# Patient Record
Sex: Female | Born: 1960 | Race: White | Hispanic: No | Marital: Married | Smoking: Never smoker
Health system: Southern US, Community
[De-identification: ages and names within clinical notes are randomized; demographics above are authoritative.]

## PROBLEM LIST (undated history)

## (undated) DIAGNOSIS — F329 Major depressive disorder, single episode, unspecified: Secondary | ICD-10-CM

## (undated) DIAGNOSIS — D0510 Intraductal carcinoma in situ of unspecified breast: Principal | ICD-10-CM

## (undated) DIAGNOSIS — Z808 Family history of malignant neoplasm of other organs or systems: Secondary | ICD-10-CM

## (undated) DIAGNOSIS — K649 Unspecified hemorrhoids: Secondary | ICD-10-CM

## (undated) DIAGNOSIS — Z803 Family history of malignant neoplasm of breast: Secondary | ICD-10-CM

## (undated) DIAGNOSIS — K5792 Diverticulitis of intestine, part unspecified, without perforation or abscess without bleeding: Secondary | ICD-10-CM

## (undated) DIAGNOSIS — Z800 Family history of malignant neoplasm of digestive organs: Secondary | ICD-10-CM

## (undated) DIAGNOSIS — F419 Anxiety disorder, unspecified: Secondary | ICD-10-CM

## (undated) DIAGNOSIS — S329XXA Fracture of unspecified parts of lumbosacral spine and pelvis, initial encounter for closed fracture: Secondary | ICD-10-CM

## (undated) HISTORY — DX: Intraductal carcinoma in situ of unspecified breast: D05.10

## (undated) HISTORY — DX: Diverticulitis of intestine, part unspecified, without perforation or abscess without bleeding: K57.92

## (undated) HISTORY — DX: Unspecified hemorrhoids: K64.9

## (undated) HISTORY — DX: Family history of malignant neoplasm of other organs or systems: Z80.8

## (undated) HISTORY — DX: Family history of malignant neoplasm of digestive organs: Z80.0

## (undated) HISTORY — DX: Fracture of unspecified parts of lumbosacral spine and pelvis, initial encounter for closed fracture: S32.9XXA

## (undated) HISTORY — DX: Family history of malignant neoplasm of breast: Z80.3

---
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
## 1986-04-24 HISTORY — PX: DILATION AND CURETTAGE OF UTERUS: SHX78

## 1997-08-10 ENCOUNTER — Ambulatory Visit (HOSPITAL_COMMUNITY): Admission: RE | Admit: 1997-08-10 | Discharge: 1997-08-10 | Payer: Self-pay | Admitting: Obstetrics and Gynecology

## 1998-09-17 ENCOUNTER — Other Ambulatory Visit: Admission: RE | Admit: 1998-09-17 | Discharge: 1998-09-17 | Payer: Self-pay | Admitting: Obstetrics and Gynecology

## 1999-11-10 ENCOUNTER — Other Ambulatory Visit: Admission: RE | Admit: 1999-11-10 | Discharge: 1999-11-10 | Payer: Self-pay | Admitting: Obstetrics and Gynecology

## 2000-11-12 ENCOUNTER — Other Ambulatory Visit: Admission: RE | Admit: 2000-11-12 | Discharge: 2000-11-12 | Payer: Self-pay | Admitting: Obstetrics and Gynecology

## 2001-02-20 ENCOUNTER — Encounter (INDEPENDENT_AMBULATORY_CARE_PROVIDER_SITE_OTHER): Payer: Self-pay

## 2001-02-20 ENCOUNTER — Ambulatory Visit (HOSPITAL_COMMUNITY): Admission: RE | Admit: 2001-02-20 | Discharge: 2001-02-20 | Payer: Self-pay | Admitting: Gastroenterology

## 2001-07-30 ENCOUNTER — Ambulatory Visit (HOSPITAL_COMMUNITY): Admission: RE | Admit: 2001-07-30 | Discharge: 2001-07-30 | Payer: Self-pay | Admitting: Family Medicine

## 2001-07-30 ENCOUNTER — Encounter: Payer: Self-pay | Admitting: Family Medicine

## 2001-11-13 ENCOUNTER — Other Ambulatory Visit: Admission: RE | Admit: 2001-11-13 | Discharge: 2001-11-13 | Payer: Self-pay | Admitting: Obstetrics and Gynecology

## 2002-03-19 ENCOUNTER — Ambulatory Visit (HOSPITAL_COMMUNITY): Admission: RE | Admit: 2002-03-19 | Discharge: 2002-03-19 | Payer: Self-pay | Admitting: Obstetrics and Gynecology

## 2002-03-19 ENCOUNTER — Encounter: Payer: Self-pay | Admitting: Obstetrics and Gynecology

## 2002-04-10 ENCOUNTER — Encounter: Payer: Self-pay | Admitting: Family Medicine

## 2002-04-10 ENCOUNTER — Ambulatory Visit (HOSPITAL_COMMUNITY): Admission: RE | Admit: 2002-04-10 | Discharge: 2002-04-10 | Payer: Self-pay | Admitting: Family Medicine

## 2002-12-10 ENCOUNTER — Other Ambulatory Visit: Admission: RE | Admit: 2002-12-10 | Discharge: 2002-12-10 | Payer: Self-pay | Admitting: Obstetrics and Gynecology

## 2004-01-12 ENCOUNTER — Ambulatory Visit (HOSPITAL_COMMUNITY): Admission: RE | Admit: 2004-01-12 | Discharge: 2004-01-12 | Payer: Self-pay | Admitting: Obstetrics and Gynecology

## 2004-01-21 ENCOUNTER — Other Ambulatory Visit: Admission: RE | Admit: 2004-01-21 | Discharge: 2004-01-21 | Payer: Self-pay | Admitting: Obstetrics and Gynecology

## 2005-02-01 ENCOUNTER — Other Ambulatory Visit: Admission: RE | Admit: 2005-02-01 | Discharge: 2005-02-01 | Payer: Self-pay | Admitting: Obstetrics and Gynecology

## 2005-02-06 ENCOUNTER — Encounter: Admission: RE | Admit: 2005-02-06 | Discharge: 2005-02-06 | Payer: Self-pay | Admitting: Obstetrics and Gynecology

## 2006-03-08 ENCOUNTER — Ambulatory Visit (HOSPITAL_COMMUNITY): Admission: RE | Admit: 2006-03-08 | Discharge: 2006-03-08 | Payer: Self-pay | Admitting: Obstetrics and Gynecology

## 2006-03-20 ENCOUNTER — Encounter: Admission: RE | Admit: 2006-03-20 | Discharge: 2006-03-20 | Payer: Self-pay | Admitting: Obstetrics and Gynecology

## 2006-04-03 ENCOUNTER — Other Ambulatory Visit: Admission: RE | Admit: 2006-04-03 | Discharge: 2006-04-03 | Payer: Self-pay | Admitting: Obstetrics & Gynecology

## 2007-04-11 ENCOUNTER — Ambulatory Visit (HOSPITAL_COMMUNITY): Admission: RE | Admit: 2007-04-11 | Discharge: 2007-04-11 | Payer: Self-pay | Admitting: Obstetrics and Gynecology

## 2007-08-23 ENCOUNTER — Other Ambulatory Visit: Admission: RE | Admit: 2007-08-23 | Discharge: 2007-08-23 | Payer: Self-pay | Admitting: Obstetrics & Gynecology

## 2008-04-14 ENCOUNTER — Ambulatory Visit (HOSPITAL_COMMUNITY): Admission: RE | Admit: 2008-04-14 | Discharge: 2008-04-14 | Payer: Self-pay | Admitting: Obstetrics and Gynecology

## 2008-08-24 ENCOUNTER — Other Ambulatory Visit: Admission: RE | Admit: 2008-08-24 | Discharge: 2008-08-24 | Payer: Self-pay | Admitting: Obstetrics and Gynecology

## 2008-09-25 ENCOUNTER — Encounter: Admission: RE | Admit: 2008-09-25 | Discharge: 2008-09-25 | Payer: Self-pay | Admitting: Sports Medicine

## 2008-10-14 ENCOUNTER — Ambulatory Visit: Payer: Self-pay | Admitting: Thoracic Surgery

## 2008-11-18 ENCOUNTER — Encounter: Admission: RE | Admit: 2008-11-18 | Discharge: 2008-11-18 | Payer: Self-pay | Admitting: Thoracic Surgery

## 2008-11-18 ENCOUNTER — Ambulatory Visit: Payer: Self-pay | Admitting: Thoracic Surgery

## 2009-01-22 LAB — HM COLONOSCOPY: HM Colonoscopy: NORMAL

## 2009-04-15 ENCOUNTER — Ambulatory Visit (HOSPITAL_COMMUNITY): Admission: RE | Admit: 2009-04-15 | Discharge: 2009-04-15 | Payer: Self-pay | Admitting: Obstetrics and Gynecology

## 2010-04-19 ENCOUNTER — Ambulatory Visit (HOSPITAL_COMMUNITY)
Admission: RE | Admit: 2010-04-19 | Discharge: 2010-04-19 | Payer: Self-pay | Source: Home / Self Care | Attending: Obstetrics and Gynecology | Admitting: Obstetrics and Gynecology

## 2010-04-20 ENCOUNTER — Encounter
Admission: RE | Admit: 2010-04-20 | Discharge: 2010-04-20 | Payer: Self-pay | Source: Home / Self Care | Attending: Obstetrics and Gynecology | Admitting: Obstetrics and Gynecology

## 2010-04-21 HISTORY — PX: BREAST BIOPSY: SHX20

## 2010-04-24 DIAGNOSIS — F419 Anxiety disorder, unspecified: Secondary | ICD-10-CM

## 2010-04-24 DIAGNOSIS — F32A Depression, unspecified: Secondary | ICD-10-CM

## 2010-04-24 HISTORY — DX: Depression, unspecified: F32.A

## 2010-04-24 HISTORY — DX: Anxiety disorder, unspecified: F41.9

## 2010-04-26 ENCOUNTER — Encounter
Admission: RE | Admit: 2010-04-26 | Discharge: 2010-04-26 | Payer: Self-pay | Source: Home / Self Care | Attending: Obstetrics and Gynecology | Admitting: Obstetrics and Gynecology

## 2010-04-29 ENCOUNTER — Ambulatory Visit: Payer: Self-pay | Admitting: Oncology

## 2010-05-02 ENCOUNTER — Ambulatory Visit: Payer: Self-pay | Admitting: Genetic Counselor

## 2010-05-06 ENCOUNTER — Encounter
Admission: RE | Admit: 2010-05-06 | Discharge: 2010-05-06 | Payer: Self-pay | Source: Home / Self Care | Attending: Obstetrics and Gynecology | Admitting: Obstetrics and Gynecology

## 2010-05-06 HISTORY — PX: BREAST BIOPSY: SHX20

## 2010-05-09 LAB — COMPREHENSIVE METABOLIC PANEL
ALT: 17 U/L (ref 0–35)
AST: 26 U/L (ref 0–37)
Albumin: 3.8 g/dL (ref 3.5–5.2)
Alkaline Phosphatase: 49 U/L (ref 39–117)
BUN: 13 mg/dL (ref 6–23)
CO2: 29 mEq/L (ref 19–32)
Calcium: 8.9 mg/dL (ref 8.4–10.5)
Chloride: 105 mEq/L (ref 96–112)
Creatinine, Ser: 0.84 mg/dL (ref 0.40–1.20)
Glucose, Bld: 110 mg/dL — ABNORMAL HIGH (ref 70–99)
Potassium: 4.1 mEq/L (ref 3.5–5.3)
Sodium: 141 mEq/L (ref 135–145)
Total Bilirubin: 0.3 mg/dL (ref 0.3–1.2)
Total Protein: 6.6 g/dL (ref 6.0–8.3)

## 2010-05-09 LAB — CBC WITH DIFFERENTIAL/PLATELET
BASO%: 0.6 % (ref 0.0–2.0)
Basophils Absolute: 0 10*3/uL (ref 0.0–0.1)
EOS%: 0.8 % (ref 0.0–7.0)
Eosinophils Absolute: 0.1 10*3/uL (ref 0.0–0.5)
HCT: 36.6 % (ref 34.8–46.6)
HGB: 12.5 g/dL (ref 11.6–15.9)
LYMPH%: 29.9 % (ref 14.0–49.7)
MCH: 34.3 pg — ABNORMAL HIGH (ref 25.1–34.0)
MCHC: 34.3 g/dL (ref 31.5–36.0)
MCV: 100.3 fL (ref 79.5–101.0)
MONO#: 0.6 10*3/uL (ref 0.1–0.9)
MONO%: 7.6 % (ref 0.0–14.0)
NEUT#: 4.6 10*3/uL (ref 1.5–6.5)
NEUT%: 61.1 % (ref 38.4–76.8)
Platelets: 202 10*3/uL (ref 145–400)
RBC: 3.65 10*6/uL — ABNORMAL LOW (ref 3.70–5.45)
RDW: 12.9 % (ref 11.2–14.5)
WBC: 7.5 10*3/uL (ref 3.9–10.3)
lymph#: 2.2 10*3/uL (ref 0.9–3.3)

## 2010-05-12 ENCOUNTER — Ambulatory Visit
Admission: RE | Admit: 2010-05-12 | Discharge: 2010-05-24 | Payer: Self-pay | Source: Home / Self Care | Attending: Radiation Oncology | Admitting: Radiation Oncology

## 2010-05-15 ENCOUNTER — Encounter: Payer: Self-pay | Admitting: Obstetrics and Gynecology

## 2010-05-25 ENCOUNTER — Other Ambulatory Visit: Payer: Self-pay | Admitting: Surgery

## 2010-05-25 DIAGNOSIS — C50911 Malignant neoplasm of unspecified site of right female breast: Secondary | ICD-10-CM

## 2010-05-25 HISTORY — PX: BREAST SURGERY: SHX581

## 2010-05-25 HISTORY — PX: HEMATOMA EVACUATION: SHX5118

## 2010-05-26 ENCOUNTER — Other Ambulatory Visit: Payer: Self-pay | Admitting: Surgery

## 2010-05-26 DIAGNOSIS — C50911 Malignant neoplasm of unspecified site of right female breast: Secondary | ICD-10-CM

## 2010-05-30 ENCOUNTER — Encounter (HOSPITAL_BASED_OUTPATIENT_CLINIC_OR_DEPARTMENT_OTHER)
Admission: RE | Admit: 2010-05-30 | Discharge: 2010-05-30 | Disposition: A | Discharge status: Home / Self Care | Payer: BC Managed Care – PPO | Source: Ambulatory Visit | Attending: Surgery | Admitting: Surgery

## 2010-05-30 DIAGNOSIS — Z01812 Encounter for preprocedural laboratory examination: Secondary | ICD-10-CM | POA: Insufficient documentation

## 2010-05-30 DIAGNOSIS — Z01810 Encounter for preprocedural cardiovascular examination: Secondary | ICD-10-CM | POA: Insufficient documentation

## 2010-05-30 LAB — URINALYSIS, ROUTINE W REFLEX MICROSCOPIC
Hgb urine dipstick: NEGATIVE
Ketones, ur: NEGATIVE mg/dL
Protein, ur: NEGATIVE mg/dL
Urobilinogen, UA: 0.2 mg/dL (ref 0.0–1.0)

## 2010-05-30 LAB — CBC
HCT: 41.1 % (ref 36.0–46.0)
MCH: 32.8 pg (ref 26.0–34.0)
MCHC: 33.6 g/dL (ref 30.0–36.0)
RDW: 12 % (ref 11.5–15.5)

## 2010-05-30 LAB — COMPREHENSIVE METABOLIC PANEL
ALT: 22 U/L (ref 0–35)
AST: 28 U/L (ref 0–37)
Albumin: 4 g/dL (ref 3.5–5.2)
Alkaline Phosphatase: 54 U/L (ref 39–117)
CO2: 28 mEq/L (ref 19–32)
Chloride: 104 mEq/L (ref 96–112)
Creatinine, Ser: 0.9 mg/dL (ref 0.4–1.2)
GFR calc Af Amer: >60 mL/min (ref >60)
GFR calc non Af Amer: >60 mL/min (ref >60)
Potassium: 5 mEq/L (ref 3.5–5.1)
Total Bilirubin: 0.6 mg/dL (ref 0.3–1.2)

## 2010-05-30 LAB — DIFFERENTIAL
Basophils Absolute: 0 10*3/uL (ref 0.0–0.1)
Basophils Relative: 1 % (ref 0–1)
Eosinophils Relative: 2 % (ref 0–5)
Monocytes Absolute: 0.6 10*3/uL (ref 0.1–1.0)

## 2010-05-30 LAB — URINE MICROSCOPIC-ADD ON

## 2010-06-02 ENCOUNTER — Other Ambulatory Visit: Payer: Self-pay | Admitting: Surgery

## 2010-06-02 ENCOUNTER — Ambulatory Visit
Admission: RE | Admit: 2010-06-02 | Discharge: 2010-06-02 | Disposition: A | Discharge status: Home / Self Care | Payer: BC Managed Care – PPO | Source: Ambulatory Visit | Attending: Surgery | Admitting: Surgery

## 2010-06-02 ENCOUNTER — Observation Stay (HOSPITAL_COMMUNITY)
Admission: EM | Admit: 2010-06-02 | Discharge: 2010-06-03 | Disposition: A | Discharge status: Home / Self Care | Payer: BC Managed Care – PPO | Attending: Surgery | Admitting: Surgery

## 2010-06-02 ENCOUNTER — Ambulatory Visit (HOSPITAL_BASED_OUTPATIENT_CLINIC_OR_DEPARTMENT_OTHER)
Admission: RE | Admit: 2010-06-02 | Discharge: 2010-06-02 | Disposition: A | Discharge status: Home / Self Care | Payer: BC Managed Care – PPO | Source: Ambulatory Visit | Attending: Surgery | Admitting: Surgery

## 2010-06-02 DIAGNOSIS — D0590 Unspecified type of carcinoma in situ of unspecified breast: Secondary | ICD-10-CM | POA: Insufficient documentation

## 2010-06-02 DIAGNOSIS — Z01818 Encounter for other preprocedural examination: Secondary | ICD-10-CM | POA: Insufficient documentation

## 2010-06-02 DIAGNOSIS — C50911 Malignant neoplasm of unspecified site of right female breast: Secondary | ICD-10-CM

## 2010-06-02 DIAGNOSIS — F3289 Other specified depressive episodes: Secondary | ICD-10-CM | POA: Insufficient documentation

## 2010-06-02 DIAGNOSIS — IMO0002 Reserved for concepts with insufficient information to code with codable children: Principal | ICD-10-CM | POA: Insufficient documentation

## 2010-06-02 DIAGNOSIS — Y849 Medical procedure, unspecified as the cause of abnormal reaction of the patient, or of later complication, without mention of misadventure at the time of the procedure: Secondary | ICD-10-CM | POA: Insufficient documentation

## 2010-06-02 DIAGNOSIS — Z01812 Encounter for preprocedural laboratory examination: Secondary | ICD-10-CM | POA: Insufficient documentation

## 2010-06-02 DIAGNOSIS — Z79899 Other long term (current) drug therapy: Secondary | ICD-10-CM | POA: Insufficient documentation

## 2010-06-02 DIAGNOSIS — N63 Unspecified lump in unspecified breast: Secondary | ICD-10-CM | POA: Insufficient documentation

## 2010-06-02 DIAGNOSIS — F329 Major depressive disorder, single episode, unspecified: Secondary | ICD-10-CM | POA: Insufficient documentation

## 2010-06-02 HISTORY — PX: BREAST LUMPECTOMY: SHX2

## 2010-06-02 LAB — CBC
MCHC: 34.5 g/dL (ref 30.0–36.0)
Platelets: 206 10*3/uL (ref 150–400)
RDW: 11.8 % (ref 11.5–15.5)
WBC: 12.3 10*3/uL — ABNORMAL HIGH (ref 4.0–10.5)

## 2010-06-02 LAB — DIFFERENTIAL
Basophils Absolute: 0 10*3/uL (ref 0.0–0.1)
Basophils Relative: 0 % (ref 0–1)
Eosinophils Absolute: 0 10*3/uL (ref 0.0–0.7)
Eosinophils Relative: 0 % (ref 0–5)
Monocytes Absolute: 0.9 10*3/uL (ref 0.1–1.0)

## 2010-06-05 NOTE — Op Note (Signed)
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  OPERATION:  Needle-guided excision right breast cancer.  SURGEON:  Currie Paris, MD  ANESTHESIA:  General.  CLINICAL HISTORY:  This is a 50 year old lady recently diagnosed with an area of DCIS in the upper outer quadrant of the right breast.  A clip was placed in the calcifications that were known to be malignant.  For her guidewire localization, a second wire was placed so we could excise nearby calcifications that had not been biopsied.  The third area in her breast had been biopsied and was normal.  DESCRIPTION OF PROCEDURE:  I saw the patient in the holding area and we marked the right breast as the operative side.  She had no further questions.  I reviewed the mammogram localizing films.  The patient was taken to the operating room, and after satisfactory general anesthesia had been obtained the breast was prepped and draped. The time-out was done.  Both guidewires were entered in the upper outer quadrant laterally and tracked to what appeared to be in a horizontal direction from lateral to medial.  I made an incision starting at the guidewire entry site between the two guidewires and went medial from there.  I raised about 1.5-cm skin flap superiorly and inferiorly and then laterally and manipulated both guidewires into the wound.  By looking at the films, I estimated where I thought I would be beyond the tip of the guidewire and then divided the  breast tissue down to the chest wall medial to the two guidewires and indeed I was medial to them. I then divided the breast tissue down to the chest wall superiorly, then inferiorly, and then freed it off by taking the fascia.  I was finally able to divide the remaining lateral attachments and in doing so noticed that the more superior of the two wires was really at the edge of my lumpectomy and was running along that area and actually tracked more from directly towards the chest wall.  I was clearly well around the wire that was going to the clip and a specimen mammogram confirmed that.  I went ahead and took an extra rim of tissue from the superolateral portion of the margin which is where I thought the guidewire had been close.  I put some Marcaine in to help with postop analgesia.  I made sure everything was dry.  I freed the breast tissue off the underlying pectoralis and closed the deeper breast tissue with some 3-0 Vicryls followed by some more Vicryls on the subcutaneous tissue, 4-0 Monocryl subcuticular, and Dermabond.The patient tolerated the procedure well, and there were no complications.  Counts  were correct.     Currie Paris, M.D.     CJS/MEDQ  D:  06/02/2010  T:  06/03/2010  Job:  [id number]  cc:   Aram Beecham P. Romine, M.D. Robert A. Nicholos Johns, M.D.  Electronically Signed by Cyndia Bent M.D. on 06/04/2010 02:42:02 PM

## 2010-06-07 NOTE — H&P (Signed)
[name], [name]                ACCOUNT NO.:  [id number]  MEDICAL RECORD NO.:  [id number]           PATIENT TYPE:  I  LOCATION:  1305                         FACILITY:  [facility]  PHYSICIAN:  Sandria Bales. Ezzard Standing, M.D.  DATE OF BIRTH:  July 09, 1960  DATE OF ADMISSION:  06/02/2010                             HISTORY & PHYSICAL  HISTORY:  This is a 50 year old white female who is a patient of Dr. Cyndia Bent who underwent a right breast lumpectomy today at [facility].  She was having a lumpectomy for right breast cancer.  She had a biopsy on April 12, 2010, that showed a DCIS which is ER 98%, PR 93%.  I did not have her mammogram reports or her other breast information.  She has seen Dr. Pierce Crane from an oncology standpoint, Dr. Lonie Peak from radiation oncology standpoint.  She sees Dr. Elias Else as her primary medical doctor.  She had the biopsy about 11 a.m. this morning, was doing well when she felt a "pop" at home around 6 p.m., noticed rapid swelling of her right breast with increasing tenderness.  She tried to stay at home but eventually called me and I agreed to meet her in the emergency room for evaluation of her right breast.  PAST MEDICAL HISTORY:  She has no allergies.  MEDICINES:  Her medicine circle primarily around depression she is having.  She has been placed on Viibryd by Dr. Milagros Evener for depression.  She takes some Sonata equivalent for sleep and takes Xanax p.r.n. for anxiety.  REVIEW OF SYSTEMS:   NEUROLOGIC:  No seizure or loss of consciousness. PULMONARY:  No history of pneumonia or tuberculosis.   CARDIAC:  She has no heart disease or chest pain.   GASTROINTESTINAL:  No history of peptic ulcer disease or liver disease.   UROLOGIC:  She has had kidney infection.    She is accompanied by her husband in the emergency room. When she first came, she had pain and nausea.  She was given some Zofran sublingually, this seem to take care of  her nausea.  She was given 0.5 mg of Dilaudid, this seem to take care of her pain. She works as an Scientist, forensic and competes in Clinical cytogeneticist.  PHYSICAL EXAMINATION:  VITAL SIGNS:  On physical exam, her temperature was 97.4, blood pressure 110/71, pulse is 55, respirations 21. HEENT:  Unremarkable. NECK:  Supple.  No mass, thyromegaly. LYMPH NODES:  She has no cervical, supraclavicular adenopathy.   BREAST: She has an incision at the upper outer quadrant of her right breast with a fairly significant hematoma under this incision.  It is tense and tender. Her left breast is unremarkable. LUNGS:  Clear to auscultation with symmetric breath sounds. HEART:  Her heart has a regular rate and rhythm without murmur or rub. ABDOMEN:  Benign.  Her hemoglobin tonight was 12.6, hematocrit 36.5, [place] [address].  She has electrolytes from May 30, 2010, which shows sodium 140, potassium 5.0, chloride of 104, CO2 of 28, glucose of 93, creatinine 0.9.  IMPRESSION: 1. Right breast hematoma.  I think  the patient will be best served     with evacuation of this hematoma.  This would be done under general anesthesia. I discussed with her there may be a slight increased risk of wound infection, but evacuating the     hematoma would help her lot with her pain and in the assymetry of her breast.  And if the hematoma were     to burst open the incision, then the risk of infection will go up dramatically     over just surgical evacuation of hematoma. 2. Depression.  She seems to be dealing with multiple stressors in her     life.  She otherwise appears to be healthy.   Sandria Bales. Ezzard Standing, M.D., FACS   DHN/MEDQ  D:  06/02/2010  T:  06/03/2010  Job:  [id number]  Electronically Signed by Ovidio Kin M.D. on 06/07/2010 09:02:45 AM

## 2010-06-07 NOTE — Op Note (Signed)
[name], [name]                ACCOUNT NO.:  [id number]  MEDICAL RECORD NO.:  [id number]           PATIENT TYPE:  E  LOCATION:  WLED                         FACILITY:  [facility]  PHYSICIAN:  Sandria Bales. Ezzard Standing, M.D.  DATE OF BIRTH:  April 19, 1961  DATE OF PROCEDURE:  06/02/2010                              OPERATIVE REPORT  PREOP DIAGNOSIS:  Right breast incisional hematoma.  POSTOPERATIVE DIAGNOSIS:  Right breast incisional hematoma (250 to 300 cc of clotted blood).  PROCEDURES:  Evacuation, irrigation of right breast hematoma.  SURGEON:  Sandria Bales. Ezzard Standing, M.D.  FIRST ASSISTANT:  None.  ANESTHESIA:  General endotracheal with approximately 30 cc of 0.25% Marcaine.  COMPLICATIONS:  None.  INDICATIONS FOR PROCEDURE:  [name] is a 50 year old white female with right breast carcinoma who underwent a right breast lumpectomy by Dr. Jamey Ripa earlier today on June 02, 2010.  Approximately, 6 hours postop, she felt a "pop", had increasing pain in her right breast with increasing swelling. On physical exam, she has a significant hematoma in right breast.    I discussed with her about taking her to the operating room to explore and evacuate this right breas hematoma.  The risks of surgery include infection, repeat bleed, and trouble wound healing.  DESCRIPTION OF PROCEDURE:  The patient in supine position, her right breast was prepped with ChloraPrep and sterilely draped.  A time-out was held and the surgical checklist run.    She was given 1 g of Ancef.  An incision was made through her old incision and she had clotted blood down to her chest wall.  She had about 250 to 300 cc of clotted old blood.  There was no specific bleeder that was identified.  She was a little "wet" up towards her axilla.  I used both Bovie electrocautery and some 2-0 Vicryl sutures for ligation purposes.  I irrigated at least 2 L of saline into the wound.  I left a gauze in for at least 5 minutes  that showed no bleeding.  I left saline in the wound to look for bleeding and saw none.  Then after irrigating 2 L, I then infiltrated the subcutaneous tissues with 30 cc of 0.25% Marcaine.  I then tried to close the wound in layers much like that Dr. Jamey Ripa had done earlier with interrupted 2-0 Vicryl sutures and 3-0 Vicryl sutures under the subcutaneous sutures, and then I did a running 5-0 subcuticular suture and painted the wound with Dermabond, placed gauze on her, rewrapped the breast.    She tolerated the procedure well, was transferred to recovery room.  We are ending this case at about 12:20 a.m. on June 03, 2010.  I will keep her overnight for observation with Dr. Jamey Ripa to follow up in the morning.    Sandria Bales. Ezzard Standing, M.D., FACS   DHN/MEDQ  D:  06/03/2010  T:  06/03/2010  Job:  [id number]  cc:   Currie Paris, M.D. [address] [place] [address]  Pierce Crane, MD Fax: [phone]  Lonie Peak  Dr. Beckie Salts  Electronically Signed by Ovidio Kin  M.D. on 06/07/2010 09:06:01 AM

## 2010-06-16 ENCOUNTER — Ambulatory Visit: Payer: BC Managed Care – PPO | Attending: Radiation Oncology | Admitting: Radiation Oncology

## 2010-06-16 ENCOUNTER — Other Ambulatory Visit: Payer: Self-pay | Admitting: Radiation Oncology

## 2010-06-16 DIAGNOSIS — D0590 Unspecified type of carcinoma in situ of unspecified breast: Secondary | ICD-10-CM | POA: Insufficient documentation

## 2010-06-16 DIAGNOSIS — R920 Mammographic microcalcification found on diagnostic imaging of breast: Secondary | ICD-10-CM

## 2010-06-16 DIAGNOSIS — Z510 Encounter for antineoplastic radiation therapy: Secondary | ICD-10-CM | POA: Insufficient documentation

## 2010-06-21 ENCOUNTER — Other Ambulatory Visit: Payer: Self-pay | Admitting: Oncology

## 2010-06-21 ENCOUNTER — Encounter (HOSPITAL_BASED_OUTPATIENT_CLINIC_OR_DEPARTMENT_OTHER): Payer: BC Managed Care – PPO | Admitting: Oncology

## 2010-06-21 DIAGNOSIS — D0590 Unspecified type of carcinoma in situ of unspecified breast: Secondary | ICD-10-CM

## 2010-06-21 LAB — COMPREHENSIVE METABOLIC PANEL
ALT: 16 U/L (ref 0–35)
AST: 21 U/L (ref 0–37)
Albumin: 4.3 g/dL (ref 3.5–5.2)
Alkaline Phosphatase: 63 U/L (ref 39–117)
BUN: 13 mg/dL (ref 6–23)
CO2: 28 mEq/L (ref 19–32)
Calcium: 9.7 mg/dL (ref 8.4–10.5)
Chloride: 102 mEq/L (ref 96–112)
Creatinine, Ser: 0.82 mg/dL (ref 0.40–1.20)
Glucose, Bld: 111 mg/dL — ABNORMAL HIGH (ref 70–99)
Potassium: 4 mEq/L (ref 3.5–5.3)
Sodium: 139 mEq/L (ref 135–145)
Total Bilirubin: 0.6 mg/dL (ref 0.3–1.2)
Total Protein: 6.7 g/dL (ref 6.0–8.3)

## 2010-06-21 LAB — CBC WITH DIFFERENTIAL/PLATELET
BASO%: 0.3 % (ref 0.0–2.0)
EOS%: 0.7 % (ref 0.0–7.0)
HCT: 37.2 % (ref 34.8–46.6)
LYMPH%: 25.4 % (ref 14.0–49.7)
MCH: 34.4 pg — ABNORMAL HIGH (ref 25.1–34.0)
MCHC: 34.3 g/dL (ref 31.5–36.0)
MONO#: 0.4 10*3/uL (ref 0.1–0.9)
NEUT%: 66.5 % (ref 38.4–76.8)
RBC: 3.71 10*6/uL (ref 3.70–5.45)
WBC: 6.3 10*3/uL (ref 3.9–10.3)
lymph#: 1.6 10*3/uL (ref 0.9–3.3)

## 2010-06-21 LAB — RESEARCH LABS

## 2010-06-27 ENCOUNTER — Ambulatory Visit
Admission: RE | Admit: 2010-06-27 | Discharge: 2010-06-27 | Disposition: A | Discharge status: Home / Self Care | Payer: BC Managed Care – PPO | Source: Ambulatory Visit | Attending: Radiation Oncology | Admitting: Radiation Oncology

## 2010-06-27 DIAGNOSIS — R920 Mammographic microcalcification found on diagnostic imaging of breast: Secondary | ICD-10-CM

## 2010-08-16 ENCOUNTER — Encounter: Payer: BC Managed Care – PPO | Admitting: Oncology

## 2010-08-16 ENCOUNTER — Other Ambulatory Visit: Payer: Self-pay | Admitting: Oncology

## 2010-09-06 NOTE — Letter (Signed)
October 14, 2008   Frazier Butt, DO  [address]  [address]  [place], [place] [address]   Re:  SHIMEKA, BACOT                DOB:  08/21/1960   Dear Dr. Margaretha Sheffield:   I appreciate the opportunity of seeing the patient.  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  She does running and she has been  noticing she has had increasing pain near the xiphoid area. She has been  treated with a steroid dosepak as well as Naprosyn as well as physical  therapy and is referred here for evaluation.  Her CT scans were  negative.  There is no history of trauma to this area, but she has  tenderness on palpation of her xiphoid.  She does have a very asthenic  build with a narrow costal margin. The people that have this particular  build are more prone to have problems with xiphoid pain.  No fevers or  chills.  No areas of infection.   PAST MEDICAL HISTORY:  She takes birth control pills and vitamin  supplements.   ALLERGIES:  Has no allergies.   FAMILY HISTORY:  Noncontributory.   SOCIAL HISTORY:  She has 2 children.  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  She has never  smoked, drinks 1-2 glasses of wine per week.   REVIEW OF SYSTEMS:  She is 130 pounds.  She is 5 feet 7.  General:  Her  weight has been stable.  Cardiac:  She gets some chest tightness,  particularly with exercise and no angina or atrial fibrillation.  Pulmonary:  No hemoptysis or bronchitis.  GI:  No nausea, vomiting,  constipation, or diarrhea.  GU:  No kidney disease, dysuria, or frequent  urination.  Vascular:  No claudication, DVT, or TIAs.  Neurological:  No  dizziness, headaches, blackouts, or seizures.  Musculoskeletal:  No  arthritis.  Psychiatric:  No depression or nervousness.  Eyes/ENT:  No  change in her eyesight or hearing.  Hematological:  No problems with  bleeding, clotting disorders, or  anemia.   PHYSICAL EXAMINATION:  GENERAL:  She is a well-developed Caucasian  female in no acute distress.  VITAL SIGNS:  Her blood pressure is 138/83, pulse 57, respirations 18,  and saturations were 99%.  HEAD, EYES, EARS, NOSE, AND THROAT:  Unremarkable.  NECK:  Supple without thyromegaly.  CHEST:  Clear to auscultation and percussion.  HEART:  Regular sinus rhythm.  No murmurs.  MUSCULOSKELETAL:  On examination of the xiphoid, there is point  tenderness at the tips of xiphoid and is very reproducible.  ABDOMEN:  Soft.  Otherwise, bowel sounds are normal.  EXTREMITIES:  Pulses are 2+.  There is no clubbing or edema.  NEUROLOGICAL:  She is oriented x3.  Sensory and motor intact.   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  As I mentioned, this is for some reason more seen in people having  narrow costal margins.  She has been treated with several types of  nonsteroidal.  I would like to try a short course of Celebrex and I gave  her a prescription for this.  I did discuss with her that if this  continues that we can do injections with steroids and Marcaine, but the  ultimate treatment of this is excision which I would say is the last  resort and only if she is having severe pain.  I appreciate the  opportunity of seeing the patient.  She will see me back in 3 weeks.  She will let us know how she does on the Celebrex.   Ines Bloomer, M.D.  Electronically Signed   DPB/MEDQ  D:  10/14/2008  T:  10/15/2008  Job:  [id number]   cc:   Molly Maduro A. Nicholos Johns MD

## 2010-09-06 NOTE — Letter (Signed)
November 18, 2008   Robert A. Nicholos Johns, MD  [address]  [place], [place] [address]   Re:  [name], [name]                DOB:  March 07, 1961   Dear Dr. Nicholos Johns:   I saw the patient back today and she has got some improvement on the  Celebrex.  It hurts occasionally when she is swimming, biking, and  running as a triathlete.  She says in October 2010, she will have some  time off and we gave her a prescription of Celebrex intake for 3 weeks  at that time to see if that will help her chronic xiphoid pain.  Her  chest x-ray today showed no acute changes and I did not give her a  followup appointment, but told her I would see her any time if she  continues to have increased pain.  We will hold off on any type of  recommendations for injections at this time.   [name], M.D.  Electronically Signed   DPB/MEDQ  D:  11/18/2008  T:  11/19/2008  Job:  [id number]   cc:   Frazier Butt, D.O.

## 2010-09-07 NOTE — Discharge Summary (Signed)
  [name], [name]                ACCOUNT NO.:  [id number]  MEDICAL RECORD NO.:  [id number]           PATIENT TYPE:  I  LOCATION:  1305                         FACILITY:  [facility]  PHYSICIAN:  Currie Paris, M.D.DATE OF BIRTH:  Oct 05, 1960  DATE OF ADMISSION:  06/02/2010 DATE OF DISCHARGE:  06/03/2010                              DISCHARGE SUMMARY   FINAL DIAGNOSIS:  Postoperative hematoma at lumpectomy site.  HISTORY OF PRESENT ILLNESS:  This patient underwent a lumpectomy earlier in the day of June 02, 2010.  She felt a "pop" around 6:00 p.m. and did notice rapid swelling in her right breast with increasing tenderness.  She was admitted by Dr. Marty Heck COURSE:  The patient was admitted and taken to the operating room where hematoma was drained and the incision reclosed.  She tolerated that procedure well.  Postoperatively, she felt much better. Dressing was dry.  Pain was well controlled and there was no evidence of bleeding.  She was able to be discharged later in the day on June 03, 2010, to resume her preoperative medications and follow up in my office next week.     Currie Paris, M.D.     CJS/MEDQ  D:  09/05/2010  T:  09/05/2010  Job:  [id number]  Electronically Signed by Cyndia Bent M.D. on 09/07/2010 08:03:23 AM

## 2010-09-20 ENCOUNTER — Ambulatory Visit
Admission: RE | Admit: 2010-09-20 | Discharge: 2010-09-20 | Disposition: A | Discharge status: Home / Self Care | Payer: BC Managed Care – PPO | Source: Ambulatory Visit | Attending: Radiation Oncology | Admitting: Radiation Oncology

## 2010-09-23 ENCOUNTER — Other Ambulatory Visit: Payer: Self-pay | Admitting: Oncology

## 2010-09-23 ENCOUNTER — Encounter (HOSPITAL_BASED_OUTPATIENT_CLINIC_OR_DEPARTMENT_OTHER): Payer: BC Managed Care – PPO | Admitting: Oncology

## 2010-09-23 DIAGNOSIS — C50419 Malignant neoplasm of upper-outer quadrant of unspecified female breast: Secondary | ICD-10-CM

## 2010-09-23 DIAGNOSIS — D0590 Unspecified type of carcinoma in situ of unspecified breast: Secondary | ICD-10-CM

## 2010-09-23 LAB — COMPREHENSIVE METABOLIC PANEL
AST: 23 U/L (ref 0–37)
Albumin: 4.3 g/dL (ref 3.5–5.2)
Alkaline Phosphatase: 70 U/L (ref 39–117)
BUN: 22 mg/dL (ref 6–23)
Glucose, Bld: 89 mg/dL (ref 70–99)
Potassium: 4.6 mEq/L (ref 3.5–5.3)
Sodium: 140 mEq/L (ref 135–145)
Total Bilirubin: 0.4 mg/dL (ref 0.3–1.2)
Total Protein: 6.7 g/dL (ref 6.0–8.3)

## 2010-09-23 LAB — CBC WITH DIFFERENTIAL/PLATELET
EOS%: 1.3 % (ref 0.0–7.0)
Eosinophils Absolute: 0.1 10*3/uL (ref 0.0–0.5)
HCT: 37.5 % (ref 34.8–46.6)
HGB: 12.9 g/dL (ref 11.6–15.9)
LYMPH%: 24.2 % (ref 14.0–49.7)
MCHC: 34.3 g/dL (ref 31.5–36.0)
MONO#: 0.5 10*3/uL (ref 0.1–0.9)
MONO%: 11.5 % (ref 0.0–14.0)
NEUT#: 2.8 10*3/uL (ref 1.5–6.5)
WBC: 4.4 10*3/uL (ref 3.9–10.3)
lymph#: 1.1 10*3/uL (ref 0.9–3.3)

## 2010-09-23 LAB — VITAMIN D 25 HYDROXY (VIT D DEFICIENCY, FRACTURES): Vit D, 25-Hydroxy: 45 ng/mL (ref 30–89)

## 2010-09-28 ENCOUNTER — Encounter (INDEPENDENT_AMBULATORY_CARE_PROVIDER_SITE_OTHER): Payer: Self-pay | Admitting: Surgery

## 2010-09-30 ENCOUNTER — Encounter: Payer: BC Managed Care – PPO | Admitting: Oncology

## 2010-09-30 ENCOUNTER — Other Ambulatory Visit: Payer: Self-pay | Admitting: Oncology

## 2010-09-30 DIAGNOSIS — C50919 Malignant neoplasm of unspecified site of unspecified female breast: Secondary | ICD-10-CM

## 2010-09-30 DIAGNOSIS — Z853 Personal history of malignant neoplasm of breast: Secondary | ICD-10-CM

## 2010-11-08 ENCOUNTER — Other Ambulatory Visit: Payer: Self-pay | Admitting: Obstetrics and Gynecology

## 2010-11-08 ENCOUNTER — Other Ambulatory Visit: Payer: Self-pay | Admitting: Oncology

## 2010-11-08 ENCOUNTER — Ambulatory Visit (HOSPITAL_COMMUNITY)
Admission: RE | Admit: 2010-11-08 | Discharge: 2010-11-08 | Disposition: A | Discharge status: Home / Self Care | Payer: BC Managed Care – PPO | Source: Ambulatory Visit | Attending: Oncology | Admitting: Oncology

## 2010-11-08 DIAGNOSIS — C50919 Malignant neoplasm of unspecified site of unspecified female breast: Secondary | ICD-10-CM

## 2010-11-08 DIAGNOSIS — Z13820 Encounter for screening for osteoporosis: Secondary | ICD-10-CM | POA: Insufficient documentation

## 2010-11-15 ENCOUNTER — Other Ambulatory Visit: Payer: Self-pay | Admitting: Obstetrics & Gynecology

## 2010-11-15 DIAGNOSIS — N920 Excessive and frequent menstruation with regular cycle: Secondary | ICD-10-CM

## 2010-11-16 ENCOUNTER — Ambulatory Visit
Admission: RE | Admit: 2010-11-16 | Discharge: 2010-11-16 | Disposition: A | Discharge status: Home / Self Care | Payer: BC Managed Care – PPO | Source: Ambulatory Visit | Attending: Obstetrics & Gynecology | Admitting: Obstetrics & Gynecology

## 2010-11-16 DIAGNOSIS — N920 Excessive and frequent menstruation with regular cycle: Secondary | ICD-10-CM

## 2010-11-16 MED ORDER — GADOBENATE DIMEGLUMINE 529 MG/ML IV SOLN
10.0000 mL | Freq: Once | INTRAVENOUS | Status: AC | PRN
  Administered 2010-11-16: 10 mL via INTRAVENOUS

## 2010-11-17 ENCOUNTER — Other Ambulatory Visit: Payer: BC Managed Care – PPO

## 2010-11-22 ENCOUNTER — Encounter (HOSPITAL_COMMUNITY): Payer: Self-pay | Admitting: *Deleted

## 2010-12-15 DIAGNOSIS — N838 Other noninflammatory disorders of ovary, fallopian tube and broad ligament: Secondary | ICD-10-CM | POA: Diagnosis present

## 2010-12-15 DIAGNOSIS — M549 Dorsalgia, unspecified: Secondary | ICD-10-CM | POA: Diagnosis present

## 2010-12-19 ENCOUNTER — Ambulatory Visit (HOSPITAL_COMMUNITY): Payer: BC Managed Care – PPO | Admitting: Anesthesiology

## 2010-12-19 ENCOUNTER — Encounter (HOSPITAL_COMMUNITY): Payer: Self-pay | Admitting: Anesthesiology

## 2010-12-19 ENCOUNTER — Ambulatory Visit (HOSPITAL_COMMUNITY)
Admission: RE | Admit: 2010-12-19 | Discharge: 2010-12-19 | Disposition: A | Discharge status: Home / Self Care | Payer: BC Managed Care – PPO | Source: Ambulatory Visit | Attending: Obstetrics & Gynecology | Admitting: Obstetrics & Gynecology

## 2010-12-19 ENCOUNTER — Encounter (HOSPITAL_COMMUNITY): Payer: Self-pay | Admitting: *Deleted

## 2010-12-19 ENCOUNTER — Encounter (HOSPITAL_COMMUNITY)
Admission: RE | Disposition: A | Discharge status: Home / Self Care | Payer: Self-pay | Source: Ambulatory Visit | Attending: Obstetrics & Gynecology

## 2010-12-19 ENCOUNTER — Other Ambulatory Visit: Payer: Self-pay | Admitting: Obstetrics & Gynecology

## 2010-12-19 DIAGNOSIS — N831 Corpus luteum cyst of ovary, unspecified side: Secondary | ICD-10-CM | POA: Insufficient documentation

## 2010-12-19 DIAGNOSIS — N838 Other noninflammatory disorders of ovary, fallopian tube and broad ligament: Secondary | ICD-10-CM | POA: Diagnosis present

## 2010-12-19 DIAGNOSIS — M549 Dorsalgia, unspecified: Secondary | ICD-10-CM | POA: Diagnosis present

## 2010-12-19 DIAGNOSIS — N83209 Unspecified ovarian cyst, unspecified side: Secondary | ICD-10-CM | POA: Insufficient documentation

## 2010-12-19 DIAGNOSIS — N841 Polyp of cervix uteri: Secondary | ICD-10-CM | POA: Insufficient documentation

## 2010-12-19 DIAGNOSIS — Z853 Personal history of malignant neoplasm of breast: Secondary | ICD-10-CM | POA: Insufficient documentation

## 2010-12-19 HISTORY — PX: LAPAROSCOPY: SHX197

## 2010-12-19 HISTORY — PX: CERVICAL POLYPECTOMY: SHX88

## 2010-12-19 HISTORY — DX: Anxiety disorder, unspecified: F41.9

## 2010-12-19 HISTORY — PX: SALPINGOOPHORECTOMY: SHX82

## 2010-12-19 HISTORY — DX: Major depressive disorder, single episode, unspecified: F32.9

## 2010-12-19 LAB — CBC
MCH: 33.7 pg (ref 26.0–34.0)
MCV: 103.1 fL — ABNORMAL HIGH (ref 78.0–100.0)
Platelets: 225 10*3/uL (ref 150–400)
RDW: 12.5 % (ref 11.5–15.5)
WBC: 5.9 10*3/uL (ref 4.0–10.5)

## 2010-12-19 LAB — SURGICAL PCR SCREEN: MRSA, PCR: NEGATIVE

## 2010-12-19 SURGERY — LAPAROSCOPY OPERATIVE
Anesthesia: General | Site: Cervix | Wound class: Clean Contaminated

## 2010-12-19 MED ORDER — HYDROCODONE-ACETAMINOPHEN 5-500 MG PO TABS: 2.0000 | ORAL_TABLET | Freq: Four times a day (QID) | ORAL | Status: AC | PRN

## 2010-12-19 MED ORDER — GLYCOPYRROLATE 0.2 MG/ML IJ SOLN
INTRAMUSCULAR | Status: DC | PRN
  Administered 2010-12-19: 0.1 mg via INTRAVENOUS
  Administered 2010-12-19: .4 mg via INTRAVENOUS

## 2010-12-19 MED ORDER — SUCCINYLCHOLINE CHLORIDE 20 MG/ML IJ SOLN
INTRAMUSCULAR | Status: DC | PRN
  Administered 2010-12-19: 100 mg via INTRAVENOUS

## 2010-12-19 MED ORDER — HYDROMORPHONE HCL 1 MG/ML IJ SOLN
INTRAMUSCULAR | Status: AC
  Filled 2010-12-19: qty 1

## 2010-12-19 MED ORDER — FENTANYL CITRATE 0.05 MG/ML IJ SOLN
INTRAMUSCULAR | Status: AC
  Administered 2010-12-19: 50 ug via INTRAVENOUS
  Filled 2010-12-19: qty 2

## 2010-12-19 MED ORDER — ONDANSETRON HCL 4 MG/2ML IJ SOLN
INTRAMUSCULAR | Status: DC | PRN
  Administered 2010-12-19: 4 mg via INTRAVENOUS

## 2010-12-19 MED ORDER — KETOROLAC TROMETHAMINE 30 MG/ML IJ SOLN: 15.0000 mg | Freq: Once | INTRAMUSCULAR | Status: DC | PRN

## 2010-12-19 MED ORDER — ROCURONIUM BROMIDE 100 MG/10ML IV SOLN
INTRAVENOUS | Status: DC | PRN
  Administered 2010-12-19: 10 mg via INTRAVENOUS
  Administered 2010-12-19: 20 mg via INTRAVENOUS
  Administered 2010-12-19: 10 mg via INTRAVENOUS

## 2010-12-19 MED ORDER — FENTANYL CITRATE 0.05 MG/ML IJ SOLN
25.0000 ug | INTRAMUSCULAR | Status: DC | PRN
  Administered 2010-12-19 (×2): 50 ug via INTRAVENOUS

## 2010-12-19 MED ORDER — KETOROLAC TROMETHAMINE 30 MG/ML IJ SOLN
INTRAMUSCULAR | Status: DC | PRN
  Administered 2010-12-19: 30 mg via INTRAVENOUS

## 2010-12-19 MED ORDER — CEFAZOLIN SODIUM 1-5 GM-% IV SOLN
INTRAVENOUS | Status: AC
  Administered 2010-12-19: 1 g via INTRAVENOUS
  Filled 2010-12-19: qty 50

## 2010-12-19 MED ORDER — CEFAZOLIN SODIUM 1-5 GM-% IV SOLN: 1.0000 g | INTRAVENOUS | Status: DC

## 2010-12-19 MED ORDER — HYDROCODONE-ACETAMINOPHEN 5-325 MG PO TABS
ORAL_TABLET | ORAL | Status: AC
  Administered 2010-12-19: 2
  Filled 2010-12-19: qty 2

## 2010-12-19 MED ORDER — HYDROMORPHONE HCL 1 MG/ML IJ SOLN
INTRAMUSCULAR | Status: DC | PRN
  Administered 2010-12-19 (×2): 0.5 mg via INTRAVENOUS

## 2010-12-19 MED ORDER — FENTANYL CITRATE 0.05 MG/ML IJ SOLN
INTRAMUSCULAR | Status: DC | PRN
  Administered 2010-12-19: 50 ug via INTRAVENOUS
  Administered 2010-12-19: 100 ug via INTRAVENOUS
  Administered 2010-12-19 (×2): 50 ug via INTRAVENOUS

## 2010-12-19 MED ORDER — LIDOCAINE HCL (CARDIAC) 20 MG/ML IV SOLN
INTRAVENOUS | Status: DC | PRN
  Administered 2010-12-19: 60 mg via INTRAVENOUS

## 2010-12-19 MED ORDER — MEPERIDINE HCL 25 MG/ML IJ SOLN
INTRAMUSCULAR | Status: AC
  Administered 2010-12-19: 6.25 mg via INTRAVENOUS
  Filled 2010-12-19: qty 1

## 2010-12-19 MED ORDER — IBUPROFEN 800 MG PO TABS: 800.0000 mg | ORAL_TABLET | Freq: Three times a day (TID) | ORAL | Status: AC | PRN

## 2010-12-19 MED ORDER — ONDANSETRON HCL 4 MG/2ML IJ SOLN: 4.0000 mg | Freq: Once | INTRAMUSCULAR | Status: DC | PRN

## 2010-12-19 MED ORDER — BUPIVACAINE HCL (PF) 0.25 % IJ SOLN
INTRAMUSCULAR | Status: DC | PRN
  Administered 2010-12-19: 6 mL

## 2010-12-19 MED ORDER — NEOSTIGMINE METHYLSULFATE 1 MG/ML IJ SOLN
INTRAMUSCULAR | Status: DC | PRN
  Administered 2010-12-19: 2 mg via INTRAMUSCULAR

## 2010-12-19 MED ORDER — DEXAMETHASONE SODIUM PHOSPHATE 10 MG/ML IJ SOLN
INTRAMUSCULAR | Status: DC | PRN
  Administered 2010-12-19: 10 mg via INTRAVENOUS

## 2010-12-19 MED ORDER — PROPOFOL 10 MG/ML IV EMUL
INTRAVENOUS | Status: DC | PRN
  Administered 2010-12-19: 200 mg via INTRAVENOUS

## 2010-12-19 MED ORDER — MUPIROCIN 2 % EX OINT
TOPICAL_OINTMENT | Freq: Two times a day (BID) | CUTANEOUS | Status: DC
  Administered 2010-12-19: 1 via NASAL

## 2010-12-19 MED ORDER — LACTATED RINGERS IV SOLN
INTRAVENOUS | Status: DC
  Administered 2010-12-19 (×2): via INTRAVENOUS

## 2010-12-19 MED ORDER — MIDAZOLAM HCL 5 MG/5ML IJ SOLN
INTRAMUSCULAR | Status: DC | PRN
  Administered 2010-12-19 (×2): 1 mg via INTRAVENOUS

## 2010-12-19 MED ORDER — MEPERIDINE HCL 25 MG/ML IJ SOLN
6.2500 mg | INTRAMUSCULAR | Status: DC | PRN
  Administered 2010-12-19 (×2): 6.25 mg via INTRAVENOUS

## 2010-12-19 SURGICAL SUPPLY — 24 items
BAG SPEC RTRVL LRG 6X4 10 (ENDOMECHANICALS)
CHLORAPREP W/TINT 26ML (MISCELLANEOUS) ×4 IMPLANT
CLOTH BEACON ORANGE TIMEOUT ST (SAFETY) ×4 IMPLANT
CORD ACTIVE DISPOSABLE (ELECTRODE) ×1
CORD ELECTRO ACTIVE DISP (ELECTRODE) ×3 IMPLANT
DERMABOND ADVANCED (GAUZE/BANDAGES/DRESSINGS) ×4 IMPLANT
ELECT BALL LEEP 5MM RED (ELECTRODE) ×4 IMPLANT
ELECT LOOP LEEP RND 15X12 GRN (CUTTING LOOP) ×4
ELECTRODE LOOP LP RND 15X12GRN (CUTTING LOOP) ×3 IMPLANT
GLOVE BIOGEL PI IND STRL 7.0 (GLOVE) ×12 IMPLANT
GLOVE BIOGEL PI INDICATOR 7.0 (GLOVE) ×4
GLOVE ECLIPSE 6.5 STRL STRAW (GLOVE) ×12 IMPLANT
GOWN PREVENTION PLUS LG XLONG (DISPOSABLE) ×8 IMPLANT
NEEDLE INSUFFLATION 14GA 120MM (NEEDLE) ×4 IMPLANT
PACK LAPAROSCOPY BASIN (CUSTOM PROCEDURE TRAY) ×4 IMPLANT
POUCH SPECIMEN RETRIEVAL 10MM (ENDOMECHANICALS) IMPLANT
SET IRRIG TUBING LAPAROSCOPIC (IRRIGATION / IRRIGATOR) ×4 IMPLANT
SUT VICRYL 0 UR6 27IN ABS (SUTURE) ×4 IMPLANT
SUT VICRYL 4-0 PS2 18IN ABS (SUTURE) ×4 IMPLANT
TOWEL OR 17X24 6PK STRL BLUE (TOWEL DISPOSABLE) ×8 IMPLANT
TRAY FOLEY CATH 14FR (SET/KITS/TRAYS/PACK) ×4 IMPLANT
TROCAR XCEL NON-BLD 11X100MML (ENDOMECHANICALS) ×4 IMPLANT
TROCAR XCEL NON-BLD 5MMX100MML (ENDOMECHANICALS) ×8 IMPLANT
WATER STERILE IRR 1000ML POUR (IV SOLUTION) ×4 IMPLANT

## 2010-12-19 NOTE — Transfer of Care (Signed)
  Anesthesia Post-op Note  Patient: [name]  Procedure(s) Performed:  LAPAROSCOPY OPERATIVE; SALPINGO OOPHERECTOMY; CERVICAL POLYPECTOMY  Patient Location: PACU  Anesthesia Type: General  Level of Consciousness: awake, alert  and oriented  Airway and Oxygen Therapy: Patient Spontanous Breathing and Patient connected to nasal cannula oxygen  Post-op Pain: none  Post-op Assessment: Post-op Vital signs reviewed and Patient's Cardiovascular Status Stable  Post-op Vital Signs: Reviewed and stable  Complications: No apparent anesthesia complications

## 2010-12-19 NOTE — Anesthesia Preprocedure Evaluation (Addendum)
Anesthesia Evaluation  Name, MR# and DOB Patient awake  General Assessment Comment  Reviewed: Allergy & Precautions, H&P , NPO status , Patient's Chart, lab work & pertinent test results, reviewed documented beta blocker date and time   Airway Mallampati: I TM Distance: >3 FB Neck ROM: full    Dental  (+) Teeth Intact   Pulmonary  clear to auscultation  pulmonary exam normalPulmonary Exam Normal breath sounds clear to auscultation none    Cardiovascular     Neuro/Psych Negative Neurological ROS    GI/Hepatic/Renal negative GI ROS  negative Liver ROS  negative Renal ROS        Endo/Other  Negative Endocrine ROS (+)      Abdominal Normal abdominal exam  (+)   Musculoskeletal negative musculoskeletal ROS (+)   Hematology negative hematology ROS (+)   Peds  Reproductive/Obstetrics negative OB ROS    Anesthesia Other Findings             Anesthesia Physical Anesthesia Plan  ASA: II  Anesthesia Plan: General   Post-op Pain Management:    Induction: Intravenous  Airway Management Planned: Oral ETT  Additional Equipment:   Intra-op Plan:   Post-operative Plan: Extubation in OR  Informed Consent: I have reviewed the patients History and Physical, chart, labs and discussed the procedure including the risks, benefits and alternatives for the proposed anesthesia with the patient or authorized representative who has indicated his/her understanding and acceptance.   Dental Advisory Given  Plan Discussed with: CRNA  Anesthesia Plan Comments:        Anesthesia Quick Evaluation

## 2010-12-19 NOTE — Op Note (Signed)
12/19/2010  12:44 PM  PATIENT:  [name]  49 y.o. female with 7cm right ovarian cyst, normal Ca-125, polypoid mass on cervix  PRE-OPERATIVE DIAGNOSIS:  Right Ovarian Cyst; History of Breast Cancer  POST-OPERATIVE DIAGNOSIS:  Right Ovarian Cyst; History of Breast Cancer; Endocervical Polyp  PROCEDURE:  Procedure(s): LAPAROSCOPY OPERATIVE SALPINGO OOPHERECTOMY CERVICAL POLYPECTOMY  SURGEON:  Samoria Fedorko,M SUZANNE  ASSISTANTS: ROMINE, CYNTHIA   ANESTHESIA:   general  ESTIMATED BLOOD LOSS: * No blood loss amount entered *  BLOOD ADMINISTERED:none   FLUIDS: 1500cc LR  UOP: 200cc concentrated but clear urine  SPECIMEN:  Cervical polyp, bilateral tubes and ovaries (right ovary marked with a stich)  DISPOSITION OF SPECIMEN:  PATHOLOGY  FINDINGS: normal pelvis except for right ovarian cyst.  Normal tubes and uterus.  Normal upper abdomen.  Normal liver edge and normal stomach edge.  No nodules.  Cervix has a 2mc polypoid mass on ectocervix  DESCRIPTION OF OPERATION: Patient was taken to the operating room. She was placed in the supine position. General endotracheal anesthesia was administered by the anesthesia staff. SCDs are on the lower extremities bilaterally and functioning normally. Legs are positioned in [place] stirrups in the low lithotomy position. The abdomen was prepped with chlor prep. The perineum, inner thighs, and vagina were prepped with Betadine. A bivalve speculum was placed in the vagina. The cervix was visualized. There was a 2 cm polypoid mass on the ectocervix inferiorly. This was grasped with a polyp forcep and with a 15 x 12 loop electrode, the base of the lesion was cauterized and the lesion was excised from the cervix. Ball cautery was used to cauterize the base.  Excellent hemostasis was present.  This tissue was handed off for pathology.  The anterior lip of the cervix was grasped with single-tooth tenaculum and a Hulka clamp was passed through the cervical canal  and attached tothe anterior lip of the cervix as a means of manipulating the uterus during the procedure. Single-tooth tenaculum was removed. The bivalve speculum was removed from the vagina. A Foley catheter was placed in the bladder to straight drain during the procedure. Legs were lowered to the low lithotomy position. Patient was draped in a normal sterile fashion.  Attention was turned to the umbilicus.  0.25% Marcaine was used to anesthetize beneath the umbilicus. A 10 mm skin incision was made with a #11 blade. The umbilical skin was tented up with Allis clamps on each either side of the umbilicus. The patient was quite thin and I felt that this was important to prevent any unintended injury to the subcutaneous fat and tissue and intra-abdominal organs. The subcutaneous fat and tissue was dissected with a hemostat. The abdomen was elevated and a Veress needle was obtained. This was passed through the infraumbilical incision. Once the peritoneum was popped through, a syringe of normal saline was obtained. This was attached to the needle. An aspiration was performed. No blood, fluid was noted. Saline injected easily into the abdomen. Another aspiration was performed and no blood, fluid, or saline was obtained. Then saline dripped easily into the needle. Under low flows of CO2 gas a pneumoperitoneum was achieved. Once 3 L of gas was in the abdomen, the needle was removed. A 10 mm nonbladed Optiview trochar/port was attached to the laparoscope. Under direct visualization of the laparoscope this was passed through the abdominal wall layers with a twisting motion. Once intra-abdominal placement was noted, the trocar was removed and the laparoscope was used to survey the  pelvis. Findings were noted as above.  Right and left lower quadrant port locations were chosen by transilluminating the abdomen. The skin was anesthetized with quarter percent Marcaine. The skin was nicked with a #11 blade. 5 mm disposable  non-bladed trochars and ports were placed in the right and left lower quadrants. The trochars were removed. The patient was placed in Trendelenburg. Using a blunt probe and an atraumatic grasper, the ovaries and tubes, uterus and pelvis were inspected.  No abnormalities were noted. The right ureter was noted very easily. The left ureter was difficult to identify but was ultimately identified higher up in the abdomen below the bifurcation of the aorta. The path of the ureter could be seen going underneath the colon and then to the sidewall.  This was well below the level of the IP ligament on the left.  With uterus on stretch to the right, the left IP ligament was serially clamped, cauterized, and incised with a bipolar gyrus. With the IP ligament  Incised, the tissue beneath the fallopian tube on the left side was serially clamped, cauterized, and incised. The uterine ovarian pedicle was then clamped, cauterized, and incised close the uterus.  This freed the left ovary and tube.  In a similar fashion, the right IP ligament was serially clamped, cauterized, and incised. The tissue beneath the tube and ovary was serially clamped, cauterized, and incised and then the utero-ovarian pedicle was serially clamped, cauterized, and incised freeing the right ovary. The right ovary was placed on the uterus. It was opened and cut in half. The diagnostic laparoscope was changed to an operative scope. Using a toothed grasper the 2 pieces of the right ovary/tube and the tube and ovary on the left side were all brought through the umbilical incision individually. No tissue remained in the pelvis.  The [place] suction irrigator was used to irrigate the pelvis. No bleeding was noted. Pedicles were all inspected with decreasing pressure of CO2 gas. Ureters were seen again with normal peristalsis.  The right and left lower quadrant ports were removed under direct visualization of the laparoscope. The laparoscope was then removed.  Patient is taken out of Trendelenberg positioning. CO2 gas was stopped and the pneumoperitoneum was relieved. The patient was given several deep breaths by the CRNA trying to any extra gas the abdomen. The umbilical port was removed.  The umbilical incision was closed at the fascial level with figure-of-eight suture of #0 Vicryl. The skin was closed at all 3 locations with subcuticular stitch of 3-0 Vicryl. The incisions were cleansed Dermabond was applied. At this point attention was turned to the vagina. Legs were lifted. A Hulka clamp was removed off of the cervix. There small amount of bleeding at the tenaculum site. This was made hemostatic with silver nitrate. A Foley catheter is removed. The prep was cleansed off the skin. Patient's legs were positioned back in the supine position. Sponge, lap, needle, and instrument counts were correct x2. Patient was awake from anesthesia and taken to recovery in stable condition.  COUNTS:  YES  PLAN OF CARE: Transfer to PACU

## 2010-12-19 NOTE — H&P (Addendum)
[name] is an 50 y.o. female G3P2 MWF with a history of right lower quadrant pain noted on annual exam on Aug 16.  Exam revealed pelvic/adnexal mass.  Ultrasound performed July 20 showed a 7.1 x 3.4 x 5.7cm avascular cystic mass.  It has a somewhat elongated shape.  Ca-125 was normal at 10.5.  MRI was also negative for any abnormal findings except for the cystic mass.  Patient underwent treatment for breast cancer 2/12 and she has lost 16 pounds.  Pertinent Gynecological History: Menses: post-menopausal Bleeding: none Contraception: none DES exposure: denies Blood transfusions: none Sexually transmitted diseases: no past history Previous GYN Procedures: none  Last mammogram: abnormal: 1/12.  Diagnosed with breast cancer. Date: 1/12 Last pap: normal Date: 712 OB History: G3, P2   Menstrual History: Menarche age: 41 Patient's last menstrual period was 11/11/2010.    Past Medical History  Diagnosis Date  . Hemorrhoid   . Cancer 2012    breast  . Anxiety 2012    with cancer, mom and brother also with cancer  . Depression 2012    self, mom, brother all diagnosed with cancer    Past Surgical History  Procedure Date  . Broken nose 1980  . Dilation and curettage of uterus 1988  . Breast surgery 05/2010    lumpectomy  . Embolectomy 05/2010    few hours after lumpectomy -    Family History  Problem Relation Age of Onset  . Cancer Mother   . Cancer Father   . Cancer Brother     Social History:  reports that she has never smoked. She does not have any smokeless tobacco history on file. She reports that she drinks about 2.4 ounces of alcohol per week. She reports that she uses illicit drugs (Marijuana).  Allergies:  Allergies  Allergen Reactions  . Adhesive (Tape) Rash    No prescriptions prior to admission    Review of Systems  Constitutional: Negative for fever and chills.  Eyes: Negative for blurred vision and double vision.  Respiratory: Negative for cough.     Cardiovascular: Negative for chest pain and palpitations.  Gastrointestinal: Negative for heartburn, nausea and vomiting.  Genitourinary: Negative for dysuria, urgency and frequency.  Musculoskeletal: Positive for back pain. Negative for myalgias.  Skin: Negative for rash.  Neurological: Negative for dizziness and headaches.  Endo/Heme/Allergies: Does not bruise/bleed easily.  Psychiatric/Behavioral: Negative for depression.    Last menstrual period 11/11/2010. Physical Exam  Vitals reviewed. Constitutional: She is oriented to person, place, and time. She appears well-developed and well-nourished.  HENT:  Head: Normocephalic and atraumatic.  Neck: Normal range of motion. Neck supple. No thyromegaly present.  Cardiovascular: Normal rate, regular rhythm and normal heart sounds.   Respiratory: Effort normal and breath sounds normal. She has no wheezes.  GI: Soft. Bowel sounds are normal. She exhibits no distension. There is no tenderness. There is no rebound.  Genitourinary: Vagina normal and uterus normal. Right adnexum displays mass and fullness.  Musculoskeletal: Normal range of motion. She exhibits no edema.  Neurological: She is alert and oriented to person, place, and time.  Skin: Skin is warm and dry.  Psychiatric: She has a normal mood and affect.    No results found for this or any previous visit (from the past 24 hour(s)).  No results found.  Assessment/Plan: 50 yo G3P2 MWF with history of cystic right adnexal mass.  Laparoscopic RSO planned.  Patient desirous to keep opposite ovary unless there is some obvious  abnormality.  Risks and benefits all discussed in my office and are documented in patient's chart.  Patient here for planned procedure.  Alila Sotero,M SUZANNE 12/19/2010, 7:18 AM  Patient has decided to proceed with laparoscopic BSO.  I am definitely in agreement because of her history of breast cancer earlier this year.  Consent addendum completed.  Patient and I have  both signed this.

## 2010-12-19 NOTE — Anesthesia Procedure Notes (Signed)
Procedure Name: Intubation Date/Time: 12/19/2010 11:03 AM Performed by: Karleen Dolphin Pre-anesthesia Checklist: Patient identified, Emergency Drugs available, Suction available, Timeout performed and Patient being monitored Patient Re-evaluated:Patient Re-evaluated prior to inductionOxygen Delivery Method: Circle System Utilized Preoxygenation: Pre-oxygenation with 100% oxygen Intubation Type: IV induction Ventilation: Mask ventilation without difficulty Laryngoscope Size: Mac and 3 Grade View: Grade I Tube type: Oral Tube size: 7.0 mm Placement Confirmation: ETT inserted through vocal cords under direct vision,  breath sounds checked- equal and bilateral and positive ETCO2 Secured at: 20 cm Tube secured with: Tape Dental Injury: Teeth and Oropharynx as per pre-operative assessment

## 2010-12-19 NOTE — Anesthesia Postprocedure Evaluation (Signed)
Anesthesia Post Note  Patient: [name]  Procedure(s) Performed:  LAPAROSCOPY OPERATIVE; SALPINGO OOPHERECTOMY; CERVICAL POLYPECTOMY  Anesthesia type: General  Patient location: PACU  Post pain: Pain level controlled  Post assessment: Post-op Vital signs reviewed  Last Vitals:  Filed Vitals:   12/19/10 0941  BP: 126/75  Pulse: 55  Temp: 97.9 F (36.6 C)  Resp: 18    Post vital signs: Reviewed  Level of consciousness: sedated  Complications: No apparent anesthesia complicationsfj

## 2011-01-03 ENCOUNTER — Encounter (HOSPITAL_COMMUNITY): Payer: Self-pay | Admitting: Obstetrics & Gynecology

## 2011-01-19 ENCOUNTER — Other Ambulatory Visit: Payer: Self-pay | Admitting: Physical Medicine and Rehabilitation

## 2011-01-19 DIAGNOSIS — M545 Low back pain, unspecified: Secondary | ICD-10-CM

## 2011-01-21 ENCOUNTER — Ambulatory Visit
Admission: RE | Admit: 2011-01-21 | Discharge: 2011-01-21 | Disposition: A | Discharge status: Home / Self Care | Payer: BC Managed Care – PPO | Source: Ambulatory Visit | Attending: Physical Medicine and Rehabilitation | Admitting: Physical Medicine and Rehabilitation

## 2011-01-21 DIAGNOSIS — M545 Low back pain, unspecified: Secondary | ICD-10-CM

## 2011-01-21 MED ORDER — GADOBENATE DIMEGLUMINE 529 MG/ML IV SOLN
10.0000 mL | Freq: Once | INTRAVENOUS | Status: AC | PRN
  Administered 2011-01-21: 10 mL via INTRAVENOUS

## 2011-02-16 ENCOUNTER — Encounter: Payer: Self-pay | Admitting: *Deleted

## 2011-02-22 ENCOUNTER — Ambulatory Visit
Admission: RE | Admit: 2011-02-22 | Discharge: 2011-02-22 | Disposition: A | Discharge status: Home / Self Care | Payer: BC Managed Care – PPO | Source: Ambulatory Visit | Attending: Radiation Oncology | Admitting: Radiation Oncology

## 2011-03-24 ENCOUNTER — Other Ambulatory Visit: Payer: Self-pay | Admitting: Physician Assistant

## 2011-03-24 ENCOUNTER — Encounter: Payer: Self-pay | Admitting: Physician Assistant

## 2011-03-24 DIAGNOSIS — D0510 Intraductal carcinoma in situ of unspecified breast: Secondary | ICD-10-CM

## 2011-03-24 DIAGNOSIS — D0511 Intraductal carcinoma in situ of right breast: Secondary | ICD-10-CM | POA: Insufficient documentation

## 2011-03-24 HISTORY — DX: Intraductal carcinoma in situ of unspecified breast: D05.10

## 2011-03-27 ENCOUNTER — Other Ambulatory Visit (HOSPITAL_BASED_OUTPATIENT_CLINIC_OR_DEPARTMENT_OTHER): Payer: BC Managed Care – PPO | Admitting: Lab

## 2011-03-27 DIAGNOSIS — D0590 Unspecified type of carcinoma in situ of unspecified breast: Secondary | ICD-10-CM

## 2011-03-27 DIAGNOSIS — D0510 Intraductal carcinoma in situ of unspecified breast: Secondary | ICD-10-CM

## 2011-03-27 LAB — COMPREHENSIVE METABOLIC PANEL
ALT: 15 U/L (ref 0–35)
BUN: 14 mg/dL (ref 6–23)
CO2: 31 mEq/L (ref 19–32)
Calcium: 9.5 mg/dL (ref 8.4–10.5)
Chloride: 101 mEq/L (ref 96–112)
Creatinine, Ser: 0.71 mg/dL (ref 0.50–1.10)
Glucose, Bld: 82 mg/dL (ref 70–99)

## 2011-03-27 LAB — CBC WITH DIFFERENTIAL/PLATELET
Basophils Absolute: 0 10*3/uL (ref 0.0–0.1)
HCT: 40.4 % (ref 34.8–46.6)
HGB: 13.7 g/dL (ref 11.6–15.9)
MONO#: 0.5 10*3/uL (ref 0.1–0.9)
NEUT%: 44.9 % (ref 38.4–76.8)
Platelets: 189 10*3/uL (ref 145–400)
WBC: 4.2 10*3/uL (ref 3.9–10.3)
lymph#: 1.7 10*3/uL (ref 0.9–3.3)

## 2011-03-27 LAB — LACTATE DEHYDROGENASE: LDH: 123 U/L (ref 94–250)

## 2011-03-30 ENCOUNTER — Ambulatory Visit
Admission: RE | Admit: 2011-03-30 | Discharge: 2011-03-30 | Disposition: A | Discharge status: Home / Self Care | Payer: BC Managed Care – PPO | Source: Ambulatory Visit | Attending: Oncology | Admitting: Oncology

## 2011-03-30 ENCOUNTER — Other Ambulatory Visit: Payer: BC Managed Care – PPO

## 2011-03-30 DIAGNOSIS — Z853 Personal history of malignant neoplasm of breast: Secondary | ICD-10-CM

## 2011-04-05 ENCOUNTER — Ambulatory Visit (HOSPITAL_COMMUNITY)
Admission: RE | Admit: 2011-04-05 | Discharge: 2011-04-05 | Disposition: A | Discharge status: Home / Self Care | Payer: BC Managed Care – PPO | Source: Ambulatory Visit | Attending: Oncology | Admitting: Oncology

## 2011-04-05 ENCOUNTER — Ambulatory Visit
Admission: RE | Admit: 2011-04-05 | Discharge: 2011-04-05 | Disposition: A | Discharge status: Home / Self Care | Payer: BC Managed Care – PPO | Source: Ambulatory Visit | Attending: Oncology | Admitting: Oncology

## 2011-04-05 ENCOUNTER — Ambulatory Visit (HOSPITAL_BASED_OUTPATIENT_CLINIC_OR_DEPARTMENT_OTHER): Payer: BC Managed Care – PPO | Admitting: Oncology

## 2011-04-05 ENCOUNTER — Telehealth: Payer: Self-pay | Admitting: *Deleted

## 2011-04-05 ENCOUNTER — Other Ambulatory Visit: Payer: Self-pay | Admitting: Obstetrics & Gynecology

## 2011-04-05 ENCOUNTER — Other Ambulatory Visit: Payer: Self-pay | Admitting: Oncology

## 2011-04-05 VITALS — BP 138/88 | HR 61 | Temp 97.8°F | Ht 67.5 in | Wt 118.2 lb

## 2011-04-05 DIAGNOSIS — D0590 Unspecified type of carcinoma in situ of unspecified breast: Secondary | ICD-10-CM

## 2011-04-05 DIAGNOSIS — Z853 Personal history of malignant neoplasm of breast: Secondary | ICD-10-CM

## 2011-04-05 DIAGNOSIS — C50919 Malignant neoplasm of unspecified site of unspecified female breast: Secondary | ICD-10-CM

## 2011-04-05 DIAGNOSIS — E559 Vitamin D deficiency, unspecified: Secondary | ICD-10-CM

## 2011-04-05 DIAGNOSIS — I889 Nonspecific lymphadenitis, unspecified: Secondary | ICD-10-CM

## 2011-04-05 DIAGNOSIS — R591 Generalized enlarged lymph nodes: Secondary | ICD-10-CM

## 2011-04-05 DIAGNOSIS — R599 Enlarged lymph nodes, unspecified: Secondary | ICD-10-CM

## 2011-04-05 DIAGNOSIS — R61 Generalized hyperhidrosis: Secondary | ICD-10-CM

## 2011-04-05 DIAGNOSIS — R590 Localized enlarged lymph nodes: Secondary | ICD-10-CM

## 2011-04-05 MED ORDER — GABAPENTIN 300 MG PO CAPS: 300.0000 mg | ORAL_CAPSULE | ORAL | Status: DC

## 2011-04-05 NOTE — Telephone Encounter (Signed)
gave patient appointment for 10-2011 with labs a week before sent patient over to radiology on 04-05-2011 for an ultrasound called breast center they are aware of the patient needing a mri of the breast

## 2011-04-05 NOTE — Progress Notes (Signed)
Hematology and Oncology Follow Up Visit  [name] [id number] Dec 09, 1960 50 y.o. 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  Interim History:  Patient returns for followup. She's been having a lot of hot flashes. This is primarily since she had her ovaries removed in September secondary to large cysts. She has difficulty sleeping as a result and take sleep medication. Aside from that she is doing well. She still fairly active has not been any significant amounts of weight. Did start taking proceed 50 mg a day for management of hot flashes.  Medications: I have reviewed the patient's current medications.  Allergies:  Allergies  Allergen Reactions  . Adhesive (Tape) Rash    Past Medical History, Surgical history, Social history, and Family History were reviewed and updated.  Review of Systems: Constitutional:  Negative for fever, chills, night sweats, anorexia, weight loss, pain. Cardiovascular: no chest pain or dyspnea on exertion Respiratory: no cough, shortness of breath, or wheezing Neurological: no TIA or stroke symptoms Dermatological: negative ENT: negative Skin Gastrointestinal: no abdominal pain, change in bowel habits, or black or bloody stools Genito-Urinary: no dysuria, trouble voiding, or hematuria Hematological and Lymphatic: negative Breast: negative for breast lumps Musculoskeletal: negative Remaining ROS negative.  Physical Exam: Blood pressure 138/88, pulse 61, temperature 97.8 F (36.6 C), temperature source Oral, height 5' 7.5" (1.715 m), weight 118 lb 3.2 oz (53.615 kg). ECOG: 0 General appearance: alert, cooperative and appears stated age Head: Normocephalic, without obvious abnormality, atraumatic Neck: no adenopathy, no carotid bruit, no JVD, supple, symmetrical,  trachea midline and thyroid not enlarged, symmetric, no tenderness/mass/nodules Lymph nodes: No palpable adenopathy of heart from the right axilla which has at least one palpable lymph node freely mobile and soft immediately adjacent to the rib cage measuring about 2 cm. Cardiac : Normal Pulmonary: Normal Breasts: Right and left breasts are free of any other masses, right breast status post lumpectomy, surgical scar is healed well. Abdomen: Normal Extremities normal Neuro: Normal  Lab Results: Lab Results  Component Value Date   WBC 4.2 03/27/2011   HGB 13.7 03/27/2011   HCT 40.4 03/27/2011   MCV 101.1* 03/27/2011   PLT 189 03/27/2011     Chemistry      Component Value Date/Time   NA 139 03/27/2011 0816   K 4.4 03/27/2011 0816   CL 101 03/27/2011 0816   CO2 31 03/27/2011 0816   BUN 14 03/27/2011 0816   CREATININE 0.71 03/27/2011 0816      Component Value Date/Time   CALCIUM 9.5 03/27/2011 0816   ALKPHOS 60 03/27/2011 0816   AST 27 03/27/2011 0816   ALT 15 03/27/2011 0816   BILITOT 0.6 03/27/2011 0816       Radiological Studies: chest X-ray n/a Mammogram Recently done- nl Bone density n/a  Impression and Plan: Pleasant postmenopausal woman history of DCIS currently on tamoxifen with hot flashes. We discussed management of this I have recommended she try gabapentin at nighttime primarily to help her sleep and manager hot flashes. I've also recommended peridin c. I will also have her undergo an ultrasound of the right axilla to assess for lymph node. I will also see her in 6 months for followup.  More than 50% of the visit was spent in patient-related counselling   Pierce Crane, MD 12/12/201210:06 AM

## 2011-04-05 NOTE — Patient Instructions (Signed)
Gabapentin capsules or tablets What is this medicine? GABAPENTIN (GA ba pen tin) is used to control partial seizures in adults with epilepsy. It is also used to treat certain types of nerve pain. This medicine may be used for other purposes; ask your health care provider or pharmacist if you have questions. What should I tell my health care provider before I take this medicine? They need to know if you have any of these conditions: -kidney disease -suicidal thoughts, plans, or attempt; a previous suicide attempt by you or a family member -an unusual or allergic reaction to gabapentin, other medicines, foods, dyes, or preservatives -pregnant or trying to get pregnant -breast-feeding How should I use this medicine? Take this medicine by mouth. Swallow it with a drink of water. Follow the directions on the prescription label. If this medicine upsets your stomach, take it with food or milk. Take your medicine at regular intervals. Do not take it more often than directed. If you are directed to break the 600 or 800 mg tablets in half as part of your dose, the extra half tablet should be used for the next dose. If you have not used the extra half tablet within 3 days, it should be thrown away. A special MedGuide will be given to you by the pharmacist with each prescription and refill. Be sure to read this information carefully each time. Talk to your pediatrician regarding the use of this medicine in children. Special care may be needed. Overdosage: If you think you have taken too much of this medicine contact a poison control center or emergency room at once. NOTE: This medicine is only for you. Do not share this medicine with others. What if I miss a dose? If you miss a dose, take it as soon as you can. If it is almost time for your next dose, take only that dose. Do not take double or extra doses. What may interact with this medicine? -antacids -hydrocodone -morphine -naproxen -sevelamer This  list may not describe all possible interactions. Give your health care provider a list of all the medicines, herbs, non-prescription drugs, or dietary supplements you use. Also tell them if you smoke, drink alcohol, or use illegal drugs. Some items may interact with your medicine. What should I watch for while using this medicine? Visit your doctor or health care professional for regular checks on your progress. You may want to keep a record at home of how you feel your condition is responding to treatment. You may want to share this information with your doctor or health care professional at each visit. You should contact your doctor or health care professional if your seizures get worse or if you have any new types of seizures. Do not stop taking this medicine or any of your seizure medicines unless instructed by your doctor or health care professional. Stopping your medicine suddenly can increase your seizures or their severity. Wear a medical identification bracelet or chain if you are taking this medicine for seizures, and carry a card that lists all your medications. You may get drowsy, dizzy, or have blurred vision. Do not drive, use machinery, or do anything that needs mental alertness until you know how this medicine affects you. To reduce dizzy or fainting spells, do not sit or stand up quickly, especially if you are an older patient. Alcohol can increase drowsiness and dizziness. Avoid alcoholic drinks. Your mouth may get dry. Chewing sugarless gum or sucking hard candy, and drinking plenty of water will help.   The use of this medicine may increase the chance of suicidal thoughts or actions. Pay special attention to how you are responding while on this medicine. Any worsening of mood, or thoughts of suicide or dying should be reported to your health care professional right away. Women who become pregnant while using this medicine may enroll in the [place] Antiepileptic Drug Pregnancy Registry  by calling 1-888-233-2334. This registry collects information about the safety of antiepileptic drug use during pregnancy. What side effects may I notice from receiving this medicine? Side effects that you should report to your doctor or health care professional as soon as possible: -allergic reactions like skin rash, itching or hives, swelling of the face, lips, or tongue -worsening of mood, thoughts or actions of suicide or dying Side effects that usually do not require medical attention (report to your doctor or health care professional if they continue or are bothersome): -constipation -difficulty walking or controlling muscle movements -nausea -slurred speech -tremors -weight gain This list may not describe all possible side effects. Call your doctor for medical advice about side effects. You may report side effects to FDA at 1-800-FDA-1088. Where should I keep my medicine? Keep out of reach of children. Store at room temperature between 15 and 30 degrees C (59 and 86 degrees F). Throw away any unused medicine after the expiration date. NOTE: This sheet is a summary. It may not cover all possible information. If you have questions about this medicine, talk to your doctor, pharmacist, or health care provider.  2012, [employer]/Gold Standard. (12/07/2009 6:06:26 PM) 

## 2011-04-13 ENCOUNTER — Ambulatory Visit
Admission: RE | Admit: 2011-04-13 | Discharge: 2011-04-13 | Disposition: A | Discharge status: Home / Self Care | Payer: BC Managed Care – PPO | Source: Ambulatory Visit | Attending: Obstetrics & Gynecology | Admitting: Obstetrics & Gynecology

## 2011-04-13 DIAGNOSIS — Z853 Personal history of malignant neoplasm of breast: Secondary | ICD-10-CM

## 2011-04-13 MED ORDER — GADOBENATE DIMEGLUMINE 529 MG/ML IV SOLN
10.0000 mL | Freq: Once | INTRAVENOUS | Status: AC | PRN
  Administered 2011-04-13: 10 mL via INTRAVENOUS

## 2011-04-14 ENCOUNTER — Ambulatory Visit (HOSPITAL_COMMUNITY): Payer: BC Managed Care – PPO

## 2011-08-03 ENCOUNTER — Other Ambulatory Visit: Payer: Self-pay | Admitting: Oncology

## 2011-08-03 DIAGNOSIS — C50419 Malignant neoplasm of upper-outer quadrant of unspecified female breast: Secondary | ICD-10-CM

## 2011-08-16 ENCOUNTER — Encounter: Payer: Self-pay | Admitting: *Deleted

## 2011-08-16 ENCOUNTER — Ambulatory Visit
Admission: RE | Admit: 2011-08-16 | Discharge: 2011-08-16 | Disposition: A | Discharge status: Home / Self Care | Payer: BC Managed Care – PPO | Source: Ambulatory Visit | Attending: Radiation Oncology | Admitting: Radiation Oncology

## 2011-08-16 ENCOUNTER — Encounter: Payer: Self-pay | Admitting: Radiation Oncology

## 2011-08-16 VITALS — BP 135/88 | HR 60 | Temp 97.2°F | Resp 18 | Wt 124.5 lb

## 2011-08-16 DIAGNOSIS — D0510 Intraductal carcinoma in situ of unspecified breast: Secondary | ICD-10-CM

## 2011-08-16 NOTE — Progress Notes (Signed)
Patient presents to the clinic today unaccompanied for a follow up appointment with Dr. Dayton Scrape. Patient is alert and oriented to person, place, and time. No distress noted. Steady gait noted. Pleasant affect noted. Patient denies pain at this time. Patient reports that she is scheduled to have an ultrasound of her uterus today. Patient denies nausea, vomiting, headache, dizziness or diarrhea. Reported all findings to Dr. Dayton Scrape.

## 2011-08-16 NOTE — Progress Notes (Signed)
Followup note:  Ms. Perri returns today approximately one year following completion of ration therapy following conservative surgery in the management of her DCIS of the right breast. She is on the [facility] genomic protocol for radiation sensitivity a. She tells me that she will have an ultrasound later today for evaluation of vague uterine discomfort. She has been on tamoxifen for one year. She had mammography followed by a MRI scan of the right breast this past December he was given a good report. She remains active competing and training.  Physical examination: Nodes: Without palpable cervical, supraclavicular, or axillary lymphadenopathy. Chest: Lungs clear. Breasts: The right breast appears normal. There is no significant pigmentation change. No masses are appreciated. Left breast without masses or lesions. Extremities without edema.  Impression: Satisfactory progress. She completes her 12 month study followup.  Plan: She'll maintain her followup through Dr. Pierce Crane. She tells me she is scheduled for followup mammography this December.

## 2011-08-16 NOTE — Progress Notes (Unsigned)
08/16/11 @ 0930, [facility] [id number], 12 Month Visit:  Ms. Stemler into the [facility] to see Dr. Dayton Scrape.  She completed the 2-month questionnaires and had photographs taken per protocol.  Dr. Dayton Scrape to complete the RTOG SOMA Evaluation.   Ms. Bouler denies the use of any new skin products on the irradiated area.  She has not had a recurrence nor changed her hormone treatment. She continues to take tamoxifen.  Thanked her for her participation in the study.

## 2011-08-23 DIAGNOSIS — S329XXA Fracture of unspecified parts of lumbosacral spine and pelvis, initial encounter for closed fracture: Secondary | ICD-10-CM

## 2011-08-23 HISTORY — DX: Fracture of unspecified parts of lumbosacral spine and pelvis, initial encounter for closed fracture: S32.9XXA

## 2011-11-03 ENCOUNTER — Other Ambulatory Visit: Payer: BC Managed Care – PPO | Admitting: Lab

## 2011-11-15 ENCOUNTER — Telehealth: Payer: Self-pay | Admitting: *Deleted

## 2011-11-15 NOTE — Telephone Encounter (Signed)
patient called in to reschedule to [phone]

## 2011-11-16 ENCOUNTER — Ambulatory Visit: Payer: BC Managed Care – PPO | Admitting: Oncology

## 2012-01-09 ENCOUNTER — Ambulatory Visit (HOSPITAL_BASED_OUTPATIENT_CLINIC_OR_DEPARTMENT_OTHER): Payer: BC Managed Care – PPO | Admitting: Oncology

## 2012-01-09 ENCOUNTER — Telehealth: Payer: Self-pay | Admitting: *Deleted

## 2012-01-09 ENCOUNTER — Other Ambulatory Visit (HOSPITAL_BASED_OUTPATIENT_CLINIC_OR_DEPARTMENT_OTHER): Payer: BC Managed Care – PPO | Admitting: Lab

## 2012-01-09 VITALS — BP 139/84 | HR 53 | Temp 97.8°F | Resp 20 | Ht 67.5 in | Wt 124.5 lb

## 2012-01-09 DIAGNOSIS — Z9079 Acquired absence of other genital organ(s): Secondary | ICD-10-CM

## 2012-01-09 DIAGNOSIS — C50919 Malignant neoplasm of unspecified site of unspecified female breast: Secondary | ICD-10-CM

## 2012-01-09 DIAGNOSIS — D0510 Intraductal carcinoma in situ of unspecified breast: Secondary | ICD-10-CM

## 2012-01-09 DIAGNOSIS — D0590 Unspecified type of carcinoma in situ of unspecified breast: Secondary | ICD-10-CM

## 2012-01-09 DIAGNOSIS — E559 Vitamin D deficiency, unspecified: Secondary | ICD-10-CM

## 2012-01-09 LAB — COMPREHENSIVE METABOLIC PANEL (CC13)
AST: 23 U/L (ref 5–34)
Albumin: 4 g/dL (ref 3.5–5.0)
Alkaline Phosphatase: 87 U/L (ref 40–150)
BUN: 21 mg/dL (ref 7.0–26.0)
Potassium: 4.9 mEq/L (ref 3.5–5.1)
Sodium: 140 mEq/L (ref 136–145)

## 2012-01-09 LAB — CBC WITH DIFFERENTIAL/PLATELET
Basophils Absolute: 0 10*3/uL (ref 0.0–0.1)
EOS%: 0.8 % (ref 0.0–7.0)
HGB: 13 g/dL (ref 11.6–15.9)
MCH: 34.5 pg — ABNORMAL HIGH (ref 25.1–34.0)
MCV: 103 fL — ABNORMAL HIGH (ref 79.5–101.0)
MONO%: 10.6 % (ref 0.0–14.0)
NEUT%: 59.4 % (ref 38.4–76.8)
RDW: 12.8 % (ref 11.2–14.5)

## 2012-01-09 NOTE — Telephone Encounter (Signed)
Gave patient appointment for 06-2012 starting at 11:30
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
## 2012-01-10 LAB — VITAMIN D 25 HYDROXY (VIT D DEFICIENCY, FRACTURES): Vit D, 25-Hydroxy: 55 ng/mL (ref 30–89)

## 2012-01-17 ENCOUNTER — Other Ambulatory Visit: Payer: Self-pay | Admitting: Oncology

## 2012-01-17 DIAGNOSIS — D0590 Unspecified type of carcinoma in situ of unspecified breast: Secondary | ICD-10-CM

## 2012-04-09 ENCOUNTER — Ambulatory Visit
Admission: RE | Admit: 2012-04-09 | Discharge: 2012-04-09 | Disposition: A | Discharge status: Home / Self Care | Payer: BC Managed Care – PPO | Source: Ambulatory Visit | Attending: Oncology | Admitting: Oncology

## 2012-04-09 DIAGNOSIS — D0510 Intraductal carcinoma in situ of unspecified breast: Secondary | ICD-10-CM

## 2012-05-31 ENCOUNTER — Telehealth: Payer: Self-pay | Admitting: *Deleted

## 2012-05-31 NOTE — Telephone Encounter (Signed)
Pt request Dr. Welton Flakes.  Confirmed new appt date and time with Larina Bras, NP at 1000 on 06/28/12.

## 2012-06-01 ENCOUNTER — Encounter: Payer: Self-pay | Admitting: Oncology

## 2012-06-13 ENCOUNTER — Telehealth: Payer: Self-pay | Admitting: *Deleted

## 2012-06-13 NOTE — Telephone Encounter (Signed)
Pt returned my call and stated that she was already tested. Results are in [place].  Emailed Maylon Cos to make her aware.

## 2012-06-13 NOTE — Telephone Encounter (Signed)
Left message for pt to return my call to see if she wants to schedule a genetic appt.

## 2012-06-27 ENCOUNTER — Telehealth: Payer: Self-pay | Admitting: Oncology

## 2012-06-27 NOTE — Telephone Encounter (Signed)
returned pt call she needed to r/s due to being on a business trip.Erica KitchenMarland KitchenDone

## 2012-06-28 ENCOUNTER — Ambulatory Visit: Payer: BC Managed Care – PPO | Admitting: Family

## 2012-07-04 ENCOUNTER — Encounter: Payer: Self-pay | Admitting: Family

## 2012-07-04 ENCOUNTER — Ambulatory Visit (HOSPITAL_BASED_OUTPATIENT_CLINIC_OR_DEPARTMENT_OTHER): Payer: 59 | Admitting: Family

## 2012-07-04 VITALS — BP 133/77 | HR 77 | Temp 97.9°F | Resp 20 | Ht 67.0 in | Wt 125.6 lb

## 2012-07-04 DIAGNOSIS — D0590 Unspecified type of carcinoma in situ of unspecified breast: Secondary | ICD-10-CM

## 2012-07-04 DIAGNOSIS — R61 Generalized hyperhidrosis: Secondary | ICD-10-CM

## 2012-07-04 DIAGNOSIS — R232 Flushing: Secondary | ICD-10-CM

## 2012-07-04 DIAGNOSIS — D0511 Intraductal carcinoma in situ of right breast: Secondary | ICD-10-CM

## 2012-07-04 DIAGNOSIS — G4700 Insomnia, unspecified: Secondary | ICD-10-CM

## 2012-07-04 MED ORDER — TAMOXIFEN CITRATE 10 MG PO TABS: 10.0000 mg | ORAL_TABLET | Freq: Every day | ORAL | Status: DC

## 2012-07-04 NOTE — Progress Notes (Signed)
[facility]  [phone] [phone]  OFFICE PROGRESS NOTE  PATIENT: [name]   DOB: 1960-08-25  MR#: [id number]  ZHY#:[id number]   CC: Fulton Mole, MD Leda Quail, MD Maryln Gottron, MD Currie Paris, MD   DIAGNOSIS: A 52 year old woman with right breast DCIS diagnosed in 03/2010.   PRIOR THERAPY: 1. Abnormal digital screening mammogram on 04/19/2010 suggesting further evaluation for calcifications in the right breast.  2.  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  3.  Bilateral breast MRI on 04/26/2010 showed a clumped and linear enhancement identified in the upper outer quadrant of the right breast, posteriorly measuring 1.7 x 0.8 x 1.4 cm. An area of clumped, non-mass like enhancement is seen in the lower central and lower outer quadrant of the right breast in the middle and posterior one third which shows progressive and plateau type kinetics. The area measured 2.3 x 2.6 x 2.6 cm in the aggregate. No mass or abnormal enhancement is seen in the left breast. No adenopathy was present.  4.  MRI guided biopsy and wire localization on 05/06/2010.  5. Genetics testing for BRCA1 and BRCA2 gene mutation were negative on 05/13/2010.  6. 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  7.  Radiation therapy from 07/07/2010 through 08/17/2010.  8. Antiestrogen therapy with Tamoxifen since 07/2010.   CURRENT THERAPY:  Annual mammography and Tamoxifen 20 mg PO daily.   INTERVAL HISTORY: Dr. Welton Flakes and I saw Mrs. Sutton Plake today for follow-up of DCIS.  The patient was last seen by Dr. Donnie Coffin on 01/09/2012. Since her last office visit patient complains of severe insomnia secondary to hot flashes and night sweats which disturbs her sleep pattern between one and two times nightly. The patient is also complaining of occasional right-sided breast discomfort.  The patient  denies any other symptomatology.   PAST MEDICAL HISTORY: Past Medical History  Diagnosis Date  . Hemorrhoid   . Anxiety 2012    with cancer, mom and brother also with cancer  . Depression 2012    self, mom, brother all diagnosed with cancer  . 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 Breast cancer   . Pelvic fracture 08/2011    PAST SURGICAL HISTORY: Past Surgical History  Procedure Laterality Date  . Broken nose  1980  . Dilation and curettage of uterus  1988  . Breast surgery  05/2010    lumpectomy  . Embolectomy  05/2010    few hours after lumpectomy -  . Laparoscopy  12/19/2010    Procedure: LAPAROSCOPY OPERATIVE;  Surgeon: Lum Keas;  Location: [facility];  Service: Gynecology;  Laterality: N/A;  . Salpingoophorectomy  12/19/2010    Procedure: SALPINGO OOPHERECTOMY;  Surgeon: Lum Keas;  Location: [facility];  Service: Gynecology;  Laterality: Bilateral;  . Cervical polypectomy  12/19/2010    Procedure: CERVICAL POLYPECTOMY;  Surgeon: Lum Keas;  Location: [facility];  Service: Gynecology;  Laterality: N/A;     FAMILY HISTORY: Family History  Problem Relation Age of Onset  . Cancer Mother     breast, colon, lung  . Cancer Father     Melanoma  . Cancer Brother     colon CA    SOCIAL HISTORY: History  Substance Use Topics  . Smoking status: Never Smoker   . Smokeless tobacco: Never Used  . Alcohol Use: 2.4 oz/week    4  Glasses of wine per week     Comment: 2 GLASSES 4X A WEEK    ALLERGIES: Allergies  Allergen Reactions  . Adhesive (Tape) Rash  . Tamiflu (Oseltamivir Phosphate) Rash    MEDICATIONS:  Current Outpatient Prescriptions  Medication Sig Dispense Refill  . amphetamine-dextroamphetamine (ADDERALL) 20 MG tablet Take 20 mg by mouth daily as needed. For attention/focus       . calcium carbonate (OS-CAL) 600 MG TABS Take 600 mg by mouth daily.       . Multiple Vitamin (MULTIVITAMIN) capsule Take 1 capsule by mouth daily.       Marland Kitchen omega-3 acid ethyl esters (LOVAZA) 1 G capsule Take 1 g by mouth 2 (two) times daily.        . temazepam (RESTORIL) 7.5 MG capsule Take 7.5 mg by mouth at bedtime as needed.        . zaleplon (SONATA) 10 MG capsule Take 10 mg by mouth at bedtime as needed. For sleep        . tamoxifen (NOLVADEX) 10 MG tablet Take 1 tablet (10 mg total) by mouth daily.  30 tablet  12   No current facility-administered medications for this visit.      REVIEW OF SYSTEMS: A 10 point review of systems was completed and is negative except as noted above.    PHYSICAL EXAMINATION: BP 133/77  Pulse 77  Temp(Src) 97.9 F (36.6 C) (Oral)  Resp 20  Ht 5\' 7"  (1.702 m)  Wt 125 lb 9.6 oz (56.972 kg)  BMI 19.67 kg/m2   General appearance: Alert, cooperative, well nourished, no apparent distress Head: Normocephalic, without obvious abnormality, atraumatic Eyes: Conjunctivae/corneas clear, PERRLA, EOMI Nose: Nares, septum and mucosa are normal, no drainage or sinus tenderness Neck: No adenopathy, supple, symmetrical, trachea midline, thyroid not enlarged, no tenderness Resp: Clear to auscultation bilaterally Cardio: Regular rate and rhythm, S1, S2 normal, no murmur, click, rub or gallop Breasts: Soft bilaterally, right breast well-healed surgical scar, right breast radiation changes noted, no lymphadenopathy, no nipple inversion, no axilla fullness, benign breast exam GI: Soft, distended, non-tender, hypoactive bowel sounds, no organomegaly Extremities: Extremities normal, atraumatic, no cyanosis or edema Lymph nodes: Cervical, supraclavicular, and axillary nodes normal Neurologic: Grossly normal    ECOG FS:   Grade 1 - Symptomatic but completely ambulatory   LAB RESULTS: Lab Results  Component Value Date   WBC 5.2 01/09/2012   NEUTROABS 3.1 01/09/2012   HGB 13.0 01/09/2012   HCT 38.8 01/09/2012   MCV 103.0* 01/09/2012   PLT 166 01/09/2012      Chemistry      Component Value Date/Time   NA 140  01/09/2012 1342   NA 139 03/27/2011 0816   K 4.9 01/09/2012 1342   K 4.4 03/27/2011 0816   CL 104 01/09/2012 1342   CL 101 03/27/2011 0816   CO2 28 01/09/2012 1342   CO2 31 03/27/2011 0816   BUN 21.0 01/09/2012 1342   BUN 14 03/27/2011 0816   CREATININE 0.9 01/09/2012 1342   CREATININE 0.71 03/27/2011 0816      Component Value Date/Time   CALCIUM 9.7 01/09/2012 1342   CALCIUM 9.5 03/27/2011 0816   ALKPHOS 87 01/09/2012 1342   ALKPHOS 60 03/27/2011 0816   AST 23 01/09/2012 1342   AST 27 03/27/2011 0816   ALT 18 01/09/2012 1342   ALT 15 03/27/2011 0816   BILITOT 0.50 01/09/2012 1342   BILITOT 0.6 03/27/2011 0816     The patient has  laboratories that were drawn by [facility] on 04/25/2012 as follows: WBC 4.1, RBC 3.87, hemoglobin 13.4, [place] 39.6, MCV 102.3, ANC 2.3, glucose 87, BUN 24, creatinine 0.78, sodium 139, potassium 4.6, Chloride 104, bicarb 31, anion gap 9.2, calcium 9.5, total protein 6.9, albumin 4.6, total bilirubin 0.6, ALP 71, AST 24, ALT 17, cholesterol 177, triglycerides 43, HDL 82, LDL 86.  RADIOGRAPHIC STUDIES: No results found.  ASSESSMENT: 52 y.[place], [place] woman with:  1.  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 Genetics testing for BRCA1 and BRCA2 gene mutation were negative. 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 She received radiation therapy from 07/07/2010 through 08/17/2010.  She has been on antiestrogen therapy with Tamoxifen since 07/2010.  2.  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  Her last bilateral digital diagnostic mammogram was on 04/09/2012 and showed no specific mammographic evidence of malignancy.  3. Her last bone density scan was on 11/08/2010 and showed a T score of 1.0 (normal).  PLAN: 1.  Dr. Welton Flakes would like to patient to decrease her dose of tamoxifen to 10 mg PO daily to see if this helps the patient with  hot flashes/night sweats.  An electronic prescription for tamoxifen 10 mg PO daily was sent to the patient's pharmacy for #30 with 12 refills.  2.  The patient will be scheduled for another bone density scan in the near future.  The patient is encouraged to continue to take calcium PO daily and a multivitamin (with vitamin D) PO daily.  3. The patient will be due for her annual bilateral digital diagnostic mammogram in 03/2013.  4.  We plan to see the patient again in six months, at which time we will check a CMP, CBC and vitamin D level.  All questions were answered.  The patient was encouraged to contact us in the interim with any problems, questions or concerns.    Larina Bras, NP-C 07/06/2012, 11:59 PM

## 2012-07-04 NOTE — Patient Instructions (Addendum)
Please contact us at [phone] if you have any questions or concerns. 

## 2012-07-08 ENCOUNTER — Telehealth: Payer: Self-pay | Admitting: Oncology

## 2012-07-08 NOTE — Telephone Encounter (Signed)
lmonvm for pt re appts for 3/20 and 9/22. Also lm re bone density test 7/18 @ [facility]. Mailed appts.

## 2012-07-11 ENCOUNTER — Ambulatory Visit: Payer: BC Managed Care – PPO | Admitting: Oncology

## 2012-07-11 ENCOUNTER — Other Ambulatory Visit: Payer: 59 | Admitting: Lab

## 2012-07-11 ENCOUNTER — Other Ambulatory Visit: Payer: BC Managed Care – PPO | Admitting: Lab

## 2012-09-25 ENCOUNTER — Other Ambulatory Visit: Payer: Self-pay

## 2012-09-25 MED ORDER — TESTOSTERONE PROPIONATE 2 % TD OINT: TOPICAL_OINTMENT | TRANSDERMAL | Status: DC

## 2012-09-25 NOTE — Telephone Encounter (Signed)
Is ok to refill this until AEX scheduled 7/14? Level checked last year. Order in, please advise.

## 2012-11-08 ENCOUNTER — Ambulatory Visit (HOSPITAL_COMMUNITY)
Admission: RE | Admit: 2012-11-08 | Discharge: 2012-11-08 | Disposition: A | Discharge status: Home / Self Care | Payer: 59 | Source: Ambulatory Visit | Attending: Family | Admitting: Family

## 2012-11-08 DIAGNOSIS — D0511 Intraductal carcinoma in situ of right breast: Secondary | ICD-10-CM

## 2012-11-08 DIAGNOSIS — Z13820 Encounter for screening for osteoporosis: Secondary | ICD-10-CM | POA: Insufficient documentation

## 2012-11-08 DIAGNOSIS — Z780 Asymptomatic menopausal state: Secondary | ICD-10-CM | POA: Insufficient documentation

## 2012-11-12 ENCOUNTER — Encounter: Payer: Self-pay | Admitting: Obstetrics & Gynecology

## 2012-11-13 ENCOUNTER — Encounter: Payer: Self-pay | Admitting: Obstetrics & Gynecology

## 2012-11-14 ENCOUNTER — Ambulatory Visit: Payer: 59 | Admitting: Obstetrics & Gynecology

## 2012-11-14 ENCOUNTER — Ambulatory Visit (INDEPENDENT_AMBULATORY_CARE_PROVIDER_SITE_OTHER): Payer: 59 | Admitting: Obstetrics & Gynecology

## 2012-11-14 VITALS — BP 100/60 | HR 52 | Ht 68.0 in | Wt 122.0 lb

## 2012-11-14 DIAGNOSIS — R5381 Other malaise: Secondary | ICD-10-CM

## 2012-11-14 DIAGNOSIS — R5383 Other fatigue: Secondary | ICD-10-CM

## 2012-11-14 DIAGNOSIS — Z01419 Encounter for gynecological examination (general) (routine) without abnormal findings: Secondary | ICD-10-CM

## 2012-11-14 DIAGNOSIS — E559 Vitamin D deficiency, unspecified: Secondary | ICD-10-CM

## 2012-11-14 DIAGNOSIS — N951 Menopausal and female climacteric states: Secondary | ICD-10-CM

## 2012-11-14 DIAGNOSIS — Z0000 Encounter for general adult medical examination without abnormal findings: Secondary | ICD-10-CM

## 2012-11-14 DIAGNOSIS — C50919 Malignant neoplasm of unspecified site of unspecified female breast: Secondary | ICD-10-CM

## 2012-11-14 DIAGNOSIS — E618 Deficiency of other specified nutrient elements: Secondary | ICD-10-CM

## 2012-11-14 LAB — POCT URINALYSIS DIPSTICK
Bilirubin, UA: NEGATIVE
Blood, UA: NEGATIVE
Ketones, UA: NEGATIVE
pH, UA: 5

## 2012-11-14 MED ORDER — TEMAZEPAM 7.5 MG PO CAPS: ORAL_CAPSULE | ORAL | Status: DC

## 2012-11-14 NOTE — Patient Instructions (Signed)
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
## 2012-11-15 LAB — ESTRADIOL: Estradiol: <11.8 pg/mL

## 2012-11-15 NOTE — Addendum Note (Signed)
Addended by: Jerene Bears on: 11/15/2012 05:34 PM   Modules accepted: Orders

## 2012-11-20 NOTE — Addendum Note (Signed)
Addended by: Clide Dales R on: 11/20/2012 04:58 PM   Modules accepted: Orders

## 2012-11-20 NOTE — Addendum Note (Signed)
Addended by: Jerene Bears on: 11/20/2012 04:59 PM   Modules accepted: Orders

## 2012-11-23 LAB — IODINE, SERUM/PLASMA: Iodine: 34 mcg/L — ABNORMAL LOW (ref 52–109)

## 2013-01-03 ENCOUNTER — Other Ambulatory Visit: Payer: Self-pay | Admitting: *Deleted

## 2013-01-03 NOTE — Telephone Encounter (Signed)
Faxed refill request received from pharmacy for TESTOSTERONE OINTMENT Last filled by MD on 09/25/12, 60G Last AEX - 11/14/12 Next AEX - 11/25/12 Please advise refills.  Paper chart on your desk.

## 2013-01-13 ENCOUNTER — Other Ambulatory Visit (HOSPITAL_BASED_OUTPATIENT_CLINIC_OR_DEPARTMENT_OTHER): Payer: 59 | Admitting: Lab

## 2013-01-13 ENCOUNTER — Encounter: Payer: Self-pay | Admitting: Oncology

## 2013-01-13 ENCOUNTER — Ambulatory Visit (HOSPITAL_BASED_OUTPATIENT_CLINIC_OR_DEPARTMENT_OTHER): Payer: 59 | Admitting: Oncology

## 2013-01-13 VITALS — BP 154/90 | HR 52 | Temp 97.7°F | Resp 20 | Ht 68.0 in | Wt 124.9 lb

## 2013-01-13 DIAGNOSIS — D0511 Intraductal carcinoma in situ of right breast: Secondary | ICD-10-CM

## 2013-01-13 DIAGNOSIS — D0590 Unspecified type of carcinoma in situ of unspecified breast: Secondary | ICD-10-CM

## 2013-01-13 LAB — COMPREHENSIVE METABOLIC PANEL (CC13)
AST: 27 U/L (ref 5–34)
Albumin: 4.1 g/dL (ref 3.5–5.0)
Alkaline Phosphatase: 90 U/L (ref 40–150)
BUN: 19.8 mg/dL (ref 7.0–26.0)
CO2: 27 mEq/L (ref 22–29)
Creatinine: 0.7 mg/dL (ref 0.6–1.1)
Glucose: 84 mg/dl (ref 70–140)
Potassium: 4 mEq/L (ref 3.5–5.1)
Total Bilirubin: 0.55 mg/dL (ref 0.20–1.20)
Total Protein: 7.3 g/dL (ref 6.4–8.3)

## 2013-01-13 LAB — CBC WITH DIFFERENTIAL/PLATELET
BASO%: 0.3 % (ref 0.0–2.0)
Eosinophils Absolute: 0.1 10*3/uL (ref 0.0–0.5)
HCT: 40.2 % (ref 34.8–46.6)
LYMPH%: 39.9 % (ref 14.0–49.7)
MCHC: 33.4 g/dL (ref 31.5–36.0)
MONO#: 0.5 10*3/uL (ref 0.1–0.9)
NEUT#: 2.1 10*3/uL (ref 1.5–6.5)
Platelets: 193 10*3/uL (ref 145–400)
RBC: 4.01 10*6/uL (ref 3.70–5.45)
WBC: 4.5 10*3/uL (ref 3.9–10.3)
lymph#: 1.8 10*3/uL (ref 0.9–3.3)

## 2013-01-13 LAB — VITAMIN D 25 HYDROXY (VIT D DEFICIENCY, FRACTURES): Vit D, 25-Hydroxy: 57 ng/mL (ref 30–89)

## 2013-01-13 NOTE — Patient Instructions (Addendum)
Doing well  Try accupuncture for night sweats  We will see you back in 6 months
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
## 2013-01-16 ENCOUNTER — Telehealth: Payer: Self-pay | Admitting: Oncology

## 2013-01-16 NOTE — Telephone Encounter (Signed)
, °

## 2013-01-16 NOTE — Telephone Encounter (Signed)
Pt calling to check on refill request since pharmacy says they do not have

## 2013-01-17 NOTE — Telephone Encounter (Signed)
Dr. Hyacinth Meeker, is this note still in your in-basket?  Please advise refills.  Thanks.

## 2013-01-20 ENCOUNTER — Other Ambulatory Visit: Payer: Self-pay | Admitting: Obstetrics & Gynecology

## 2013-01-20 MED ORDER — TESTOSTERONE PROPIONATE 2 % TD OINT: TOPICAL_OINTMENT | TRANSDERMAL | Status: DC

## 2013-01-20 NOTE — Telephone Encounter (Signed)
rx done.  Needs to be faxed.

## 2013-01-20 NOTE — Telephone Encounter (Signed)
Patient calling to check on the status of this refill request. Patient is going out of town at the end of the week and hopes to have by then.

## 2013-02-27 ENCOUNTER — Other Ambulatory Visit: Payer: Self-pay

## 2013-03-27 ENCOUNTER — Other Ambulatory Visit: Payer: Self-pay | Admitting: Obstetrics & Gynecology

## 2013-03-27 DIAGNOSIS — Z9889 Other specified postprocedural states: Secondary | ICD-10-CM

## 2013-03-31 ENCOUNTER — Telehealth: Payer: Self-pay | Admitting: Obstetrics & Gynecology

## 2013-03-31 NOTE — Telephone Encounter (Signed)
Pt dropped off form to be filed out for work stating she had her annual exam this year.

## 2013-04-02 NOTE — Telephone Encounter (Signed)
This has been done but there was no info of where to fax it.  i dont know if kelly called her or not yesterday.  Can you follow up with her and ask if she wants it mailed, faxed, or to pick up.  Thanks.

## 2013-04-03 ENCOUNTER — Other Ambulatory Visit: Payer: Self-pay | Admitting: Dermatology

## 2013-04-09 ENCOUNTER — Other Ambulatory Visit: Payer: Self-pay

## 2013-04-09 ENCOUNTER — Encounter: Payer: Self-pay | Admitting: Neurology

## 2013-04-09 ENCOUNTER — Other Ambulatory Visit: Payer: Self-pay | Admitting: Obstetrics & Gynecology

## 2013-04-09 ENCOUNTER — Ambulatory Visit (INDEPENDENT_AMBULATORY_CARE_PROVIDER_SITE_OTHER): Payer: 59 | Admitting: Neurology

## 2013-04-09 VITALS — BP 147/87 | HR 57 | Temp 98.1°F | Ht 68.5 in | Wt 125.0 lb

## 2013-04-09 DIAGNOSIS — G479 Sleep disorder, unspecified: Secondary | ICD-10-CM

## 2013-04-09 DIAGNOSIS — Z9889 Other specified postprocedural states: Secondary | ICD-10-CM

## 2013-04-09 DIAGNOSIS — R61 Generalized hyperhidrosis: Secondary | ICD-10-CM

## 2013-04-09 DIAGNOSIS — R0989 Other specified symptoms and signs involving the circulatory and respiratory systems: Secondary | ICD-10-CM

## 2013-04-09 DIAGNOSIS — R0683 Snoring: Secondary | ICD-10-CM

## 2013-04-09 DIAGNOSIS — G478 Other sleep disorders: Secondary | ICD-10-CM

## 2013-04-09 DIAGNOSIS — R0609 Other forms of dyspnea: Secondary | ICD-10-CM

## 2013-04-09 DIAGNOSIS — M7989 Other specified soft tissue disorders: Secondary | ICD-10-CM
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
## 2013-04-09 NOTE — Patient Instructions (Addendum)
Please do not drive if you feel sleepy. We will do a sleep study. You can take your trazodone for the sleep study, and please avoid Adderall for about a week before the sleep study and do not take a nap the day of the study and avoid caffeine during the day of your sleep study. I will see you back after your sleep study to go over the test results and where to go from there. We will call you after your sleep study and to set up an appointment at the time.   Please remember to try to maintain good sleep hygiene, which means: Keep a regular sleep and wake schedule, try not to exercise or have a meal within 2 hours of your bedtime, try to keep your bedroom conducive for sleep, that is, cool and dark, without light distractors such as an illuminated alarm clock, and refrain from watching TV right before sleep or in the middle of the night and do not keep the TV or radio on during the night. Also, try not to use or play on electronic devices at bedtime, such as your cell phone, tablet PC or laptop. If you like to read at bedtime on an electronic device, try to dim the background light as much as possible. Do not eat in the middle of the night.

## 2013-04-14 ENCOUNTER — Ambulatory Visit
Admission: RE | Admit: 2013-04-14 | Discharge: 2013-04-14 | Disposition: A | Discharge status: Home / Self Care | Payer: 59 | Source: Ambulatory Visit | Attending: Obstetrics & Gynecology | Admitting: Obstetrics & Gynecology

## 2013-04-14 DIAGNOSIS — Z9889 Other specified postprocedural states: Secondary | ICD-10-CM

## 2013-04-16 NOTE — Telephone Encounter (Signed)
Confirmed with Lorrene Reid this form was done.

## 2013-04-21 ENCOUNTER — Telehealth: Payer: Self-pay | Admitting: Neurology

## 2013-04-21 NOTE — Telephone Encounter (Signed)
Called the patient and left a message on her voicemail advising her that [facility] denied the request for a NPSG.  Per Dr. Frances Furbish, do not proceed with a peer to peer, pt to proceed with HST setup.  Explained on the message that HST covered at 100% and she will be contacted by the sleep lab manager to arrange a time for HST setup.

## 2013-04-28 ENCOUNTER — Other Ambulatory Visit: Payer: Self-pay | Admitting: *Deleted

## 2013-04-28 ENCOUNTER — Telehealth: Payer: Self-pay | Admitting: Obstetrics & Gynecology

## 2013-04-28 DIAGNOSIS — G4733 Obstructive sleep apnea (adult) (pediatric): Secondary | ICD-10-CM

## 2013-04-28 DIAGNOSIS — R6882 Decreased libido: Secondary | ICD-10-CM

## 2013-04-28 NOTE — Telephone Encounter (Signed)
Called pt to schedule HST, scheduled instruct for Tuesday 04/29/12 at 1:00 PM.  NPSG was cancelled.

## 2013-04-29 ENCOUNTER — Ambulatory Visit (INDEPENDENT_AMBULATORY_CARE_PROVIDER_SITE_OTHER): Payer: 59 | Admitting: *Deleted

## 2013-04-29 ENCOUNTER — Encounter: Payer: Self-pay | Admitting: *Deleted

## 2013-04-29 VITALS — HR 78

## 2013-04-29 DIAGNOSIS — G4713 Recurrent hypersomnia: Secondary | ICD-10-CM

## 2013-04-29 DIAGNOSIS — G4733 Obstructive sleep apnea (adult) (pediatric): Secondary | ICD-10-CM

## 2013-04-29 DIAGNOSIS — G4730 Sleep apnea, unspecified: Secondary | ICD-10-CM

## 2013-04-29 DIAGNOSIS — G4710 Hypersomnia, unspecified: Secondary | ICD-10-CM

## 2013-04-29 NOTE — Sleep Study (Signed)
Patient arrives for HST instruction.  Patient is given written instructions, picture instructions, and a demonstration on how to use HST unit.  All questions / concerns were addressed by technologist.  Financial responsibility was explained.  Follow up information was given to patient regarding how results would be received.  She describes a resting heart rate of around 45 when she awakens.  She awakens during the night with severe night sweats and a racing pulse.  Special attention will be paid to her heart rate data.

## 2013-05-06 NOTE — Telephone Encounter (Signed)
Patient appointment scheduled for 05/07/13. Aware will call with results and then refill accordingly.

## 2013-05-06 NOTE — Telephone Encounter (Signed)
See if she will come for repeat testosterone level.  Order placed.

## 2013-05-07 ENCOUNTER — Other Ambulatory Visit (INDEPENDENT_AMBULATORY_CARE_PROVIDER_SITE_OTHER): Payer: 59

## 2013-05-07 DIAGNOSIS — R6882 Decreased libido: Secondary | ICD-10-CM

## 2013-05-08 LAB — TESTOSTERONE: Testosterone: 39 ng/dL (ref 10–70)

## 2013-05-13 ENCOUNTER — Telehealth: Payer: Self-pay | Admitting: *Deleted

## 2013-05-13 ENCOUNTER — Telehealth: Payer: Self-pay | Admitting: Obstetrics & Gynecology

## 2013-05-13 MED ORDER — TESTOSTERONE PROPIONATE 2 % TD OINT: TOPICAL_OINTMENT | TRANSDERMAL | Status: DC

## 2013-05-13 NOTE — Telephone Encounter (Signed)
Called patient to discuss sleep study results - LM of normal results on home voicemail.  Will mail copy of report to patient.  I told her Dr. Rexene Alberts will be happy to meet with her in office ASAP to discuss her recommendations.  I will send a letter to her with this information as well. Copy of report sent to Dr. Toy Care. -sh

## 2013-05-13 NOTE — Sleep Study (Signed)
See media tab for full report  

## 2013-05-13 NOTE — Telephone Encounter (Signed)
[name] from [facility] is calling for refill status.

## 2013-05-13 NOTE — Telephone Encounter (Signed)
Routing to Dr. Sabra Heck. I pended a new order, not sure if needs dosage change.

## 2013-05-14 NOTE — Telephone Encounter (Signed)
Called [name] from [facility]. Rx received.

## 2013-06-06 ENCOUNTER — Other Ambulatory Visit: Payer: Self-pay | Admitting: Obstetrics & Gynecology

## 2013-06-06 NOTE — Telephone Encounter (Signed)
Last AEX 11/14/2012 Last refill 11/14/12 #60/3 refills Next appt 11/25/2013

## 2013-07-07 ENCOUNTER — Ambulatory Visit (HOSPITAL_BASED_OUTPATIENT_CLINIC_OR_DEPARTMENT_OTHER): Payer: 59 | Admitting: Oncology

## 2013-07-07 ENCOUNTER — Other Ambulatory Visit (HOSPITAL_BASED_OUTPATIENT_CLINIC_OR_DEPARTMENT_OTHER): Payer: 59

## 2013-07-07 ENCOUNTER — Telehealth: Payer: Self-pay | Admitting: *Deleted

## 2013-07-07 ENCOUNTER — Encounter: Payer: Self-pay | Admitting: Oncology

## 2013-07-07 VITALS — BP 148/87 | HR 56 | Temp 97.4°F | Resp 18 | Ht 68.5 in | Wt 124.8 lb

## 2013-07-07 DIAGNOSIS — D0590 Unspecified type of carcinoma in situ of unspecified breast: Secondary | ICD-10-CM

## 2013-07-07 DIAGNOSIS — D0510 Intraductal carcinoma in situ of unspecified breast: Secondary | ICD-10-CM

## 2013-07-07 DIAGNOSIS — D0511 Intraductal carcinoma in situ of right breast: Secondary | ICD-10-CM

## 2013-07-07 DIAGNOSIS — Z170 Estrogen receptor positive status [ER+]: Secondary | ICD-10-CM

## 2013-07-07 LAB — CBC WITH DIFFERENTIAL/PLATELET
BASO%: 0.2 % (ref 0.0–2.0)
Basophils Absolute: 0 10*3/uL (ref 0.0–0.1)
EOS%: 1.4 % (ref 0.0–7.0)
Eosinophils Absolute: 0.1 10*3/uL (ref 0.0–0.5)
HEMATOCRIT: 39.8 % (ref 34.8–46.6)
HGB: 13.3 g/dL (ref 11.6–15.9)
LYMPH%: 21 % (ref 14.0–49.7)
MCH: 33.6 pg (ref 25.1–34.0)
MCHC: 33.5 g/dL (ref 31.5–36.0)
MCV: 100.1 fL (ref 79.5–101.0)
MONO#: 0.4 10*3/uL (ref 0.1–0.9)
MONO%: 7.6 % (ref 0.0–14.0)
NEUT#: 4 10*3/uL (ref 1.5–6.5)
NEUT%: 69.8 % (ref 38.4–76.8)
Platelets: 198 10*3/uL (ref 145–400)
RBC: 3.97 10*6/uL (ref 3.70–5.45)
RDW: 13.1 % (ref 11.2–14.5)
WBC: 5.8 10*3/uL (ref 3.9–10.3)
lymph#: 1.2 10*3/uL (ref 0.9–3.3)

## 2013-07-07 LAB — COMPREHENSIVE METABOLIC PANEL (CC13)
ALT: 18 U/L (ref 0–55)
ANION GAP: 11 meq/L (ref 3–11)
AST: 24 U/L (ref 5–34)
Albumin: 4.4 g/dL (ref 3.5–5.0)
Alkaline Phosphatase: 85 U/L (ref 40–150)
BILIRUBIN TOTAL: 0.49 mg/dL (ref 0.20–1.20)
BUN: 21.6 mg/dL (ref 7.0–26.0)
CO2: 27 meq/L (ref 22–29)
CREATININE: 0.8 mg/dL (ref 0.6–1.1)
Calcium: 10 mg/dL (ref 8.4–10.4)
Chloride: 105 mEq/L (ref 98–109)
Glucose: 84 mg/dl (ref 70–140)
Potassium: 4.2 mEq/L (ref 3.5–5.1)
Sodium: 143 mEq/L (ref 136–145)
Total Protein: 7.2 g/dL (ref 6.4–8.3)
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
## 2013-07-07 NOTE — Telephone Encounter (Signed)
appts made and printed...td 

## 2013-08-27 ENCOUNTER — Ambulatory Visit
Admission: RE | Admit: 2013-08-27 | Discharge: 2013-08-27 | Disposition: A | Discharge status: Home / Self Care | Payer: 59 | Source: Ambulatory Visit | Attending: Family Medicine | Admitting: Family Medicine

## 2013-08-27 ENCOUNTER — Other Ambulatory Visit: Payer: Self-pay | Admitting: Family Medicine

## 2013-08-27 DIAGNOSIS — R1031 Right lower quadrant pain: Secondary | ICD-10-CM

## 2013-08-27 MED ORDER — IOHEXOL 300 MG/ML  SOLN
100.0000 mL | Freq: Once | INTRAMUSCULAR | Status: AC | PRN
  Administered 2013-08-27: 100 mL via INTRAVENOUS

## 2013-10-30 ENCOUNTER — Other Ambulatory Visit: Payer: Self-pay | Admitting: Gastroenterology

## 2013-10-30 DIAGNOSIS — R109 Unspecified abdominal pain: Secondary | ICD-10-CM

## 2013-11-18 ENCOUNTER — Ambulatory Visit
Admission: RE | Admit: 2013-11-18 | Discharge: 2013-11-18 | Disposition: A | Discharge status: Home / Self Care | Payer: 59 | Source: Ambulatory Visit | Attending: Gastroenterology | Admitting: Gastroenterology

## 2013-11-18 DIAGNOSIS — R109 Unspecified abdominal pain: Secondary | ICD-10-CM

## 2013-11-18 MED ORDER — IOHEXOL 300 MG/ML  SOLN
100.0000 mL | Freq: Once | INTRAMUSCULAR | Status: AC | PRN
  Administered 2013-11-18: 100 mL via INTRAVENOUS

## 2013-11-25 ENCOUNTER — Ambulatory Visit: Payer: 59 | Admitting: Obstetrics & Gynecology

## 2013-12-08 ENCOUNTER — Telehealth: Payer: Self-pay | Admitting: Obstetrics & Gynecology

## 2013-12-08 NOTE — Telephone Encounter (Signed)
LMTCB re: appointment with Dr. Sabra Heck available 12/11/13 at 10:30 AM.

## 2013-12-08 NOTE — Telephone Encounter (Signed)
Pt called back and took the appt

## 2013-12-10 ENCOUNTER — Ambulatory Visit: Payer: 59 | Admitting: Gynecology

## 2013-12-11 ENCOUNTER — Encounter: Payer: Self-pay | Admitting: Obstetrics & Gynecology

## 2013-12-11 ENCOUNTER — Ambulatory Visit (INDEPENDENT_AMBULATORY_CARE_PROVIDER_SITE_OTHER): Payer: 59 | Admitting: Obstetrics & Gynecology

## 2013-12-11 VITALS — BP 110/68 | HR 48 | Resp 12 | Ht 67.75 in | Wt 120.2 lb

## 2013-12-11 DIAGNOSIS — Z01419 Encounter for gynecological examination (general) (routine) without abnormal findings: Secondary | ICD-10-CM

## 2013-12-11 DIAGNOSIS — Z0000 Encounter for general adult medical examination without abnormal findings: Secondary | ICD-10-CM

## 2013-12-11 DIAGNOSIS — Z124 Encounter for screening for malignant neoplasm of cervix: Secondary | ICD-10-CM

## 2013-12-11 LAB — CBC WITH DIFFERENTIAL/PLATELET
BASOS ABS: 0 10*3/uL (ref 0.0–0.1)
Basophils Relative: 0 % (ref 0–1)
EOS ABS: 0.1 10*3/uL (ref 0.0–0.7)
EOS PCT: 2 % (ref 0–5)
HCT: 40.6 % (ref 36.0–46.0)
Hemoglobin: 13.9 g/dL (ref 12.0–15.0)
LYMPHS PCT: 46 % (ref 12–46)
Lymphs Abs: 1.9 10*3/uL (ref 0.7–4.0)
MCH: 33.6 pg (ref 26.0–34.0)
MCHC: 34.2 g/dL (ref 30.0–36.0)
MCV: 98.1 fL (ref 78.0–100.0)
Monocytes Absolute: 0.5 10*3/uL (ref 0.1–1.0)
Monocytes Relative: 12 % (ref 3–12)
Neutro Abs: 1.6 10*3/uL — ABNORMAL LOW (ref 1.7–7.7)
Neutrophils Relative %: 40 % — ABNORMAL LOW (ref 43–77)
Platelets: 225 10*3/uL (ref 150–400)
RBC: 4.14 MIL/uL (ref 3.87–5.11)
RDW: 13.2 % (ref 11.5–15.5)
WBC: 4.1 10*3/uL (ref 4.0–10.5)

## 2013-12-11 LAB — POCT URINALYSIS DIPSTICK
Bilirubin, UA: NEGATIVE
Glucose, UA: NEGATIVE
Ketones, UA: NEGATIVE
Nitrite, UA: NEGATIVE
Protein, UA: NEGATIVE
RBC UA: NEGATIVE
Urobilinogen, UA: NEGATIVE
pH, UA: 5

## 2013-12-11 LAB — COMPREHENSIVE METABOLIC PANEL
ALK PHOS: 67 U/L (ref 39–117)
ALT: 28 U/L (ref 0–35)
AST: 30 U/L (ref 0–37)
Albumin: 4.7 g/dL (ref 3.5–5.2)
BILIRUBIN TOTAL: 0.8 mg/dL (ref 0.2–1.2)
BUN: 15 mg/dL (ref 6–23)
CO2: 30 mEq/L (ref 19–32)
CREATININE: 0.77 mg/dL (ref 0.50–1.10)
Calcium: 9.7 mg/dL (ref 8.4–10.5)
Chloride: 100 mEq/L (ref 96–112)
Glucose, Bld: 83 mg/dL (ref 70–99)
Potassium: 4.3 mEq/L (ref 3.5–5.3)
Sodium: 138 mEq/L (ref 135–145)
Total Protein: 7 g/dL (ref 6.0–8.3)

## 2013-12-11 LAB — HEMOGLOBIN, FINGERSTICK: Hemoglobin, fingerstick: 13.9 g/dL (ref 12.0–16.0)

## 2013-12-11 LAB — TSH: TSH: 2.013 u[IU]/mL (ref 0.350–4.500)

## 2013-12-11 MED ORDER — TEMAZEPAM 7.5 MG PO CAPS: ORAL_CAPSULE | ORAL | Status: DC
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
## 2013-12-15 LAB — IPS PAP TEST WITH REFLEX TO HPV

## 2013-12-18 MED ORDER — NONFORMULARY OR COMPOUNDED ITEM: Status: DC

## 2014-02-23 ENCOUNTER — Encounter: Payer: Self-pay | Admitting: Obstetrics & Gynecology

## 2014-04-02 ENCOUNTER — Other Ambulatory Visit: Payer: Self-pay | Admitting: Obstetrics & Gynecology

## 2014-04-02 DIAGNOSIS — Z853 Personal history of malignant neoplasm of breast: Secondary | ICD-10-CM

## 2014-04-07 ENCOUNTER — Telehealth: Payer: Self-pay | Admitting: Hematology and Oncology

## 2014-04-07 NOTE — Telephone Encounter (Signed)
, °

## 2014-04-13 ENCOUNTER — Other Ambulatory Visit: Payer: Self-pay | Admitting: Obstetrics & Gynecology

## 2014-04-13 ENCOUNTER — Other Ambulatory Visit: Payer: Self-pay

## 2014-04-13 DIAGNOSIS — Z853 Personal history of malignant neoplasm of breast: Secondary | ICD-10-CM

## 2014-04-20 ENCOUNTER — Ambulatory Visit
Admission: RE | Admit: 2014-04-20 | Discharge: 2014-04-20 | Disposition: A | Discharge status: Home / Self Care | Payer: 59 | Source: Ambulatory Visit | Attending: Obstetrics & Gynecology | Admitting: Obstetrics & Gynecology

## 2014-04-20 DIAGNOSIS — Z853 Personal history of malignant neoplasm of breast: Secondary | ICD-10-CM

## 2014-07-09 ENCOUNTER — Ambulatory Visit: Payer: 59 | Admitting: Oncology

## 2014-07-09 ENCOUNTER — Other Ambulatory Visit: Payer: 59

## 2014-07-13 ENCOUNTER — Other Ambulatory Visit: Payer: Self-pay

## 2014-07-13 DIAGNOSIS — D0510 Intraductal carcinoma in situ of unspecified breast: Secondary | ICD-10-CM

## 2014-07-14 ENCOUNTER — Other Ambulatory Visit (HOSPITAL_BASED_OUTPATIENT_CLINIC_OR_DEPARTMENT_OTHER): Payer: Managed Care, Other (non HMO)

## 2014-07-14 ENCOUNTER — Ambulatory Visit (HOSPITAL_BASED_OUTPATIENT_CLINIC_OR_DEPARTMENT_OTHER): Payer: Managed Care, Other (non HMO) | Admitting: Hematology and Oncology

## 2014-07-14 ENCOUNTER — Telehealth: Payer: Self-pay | Admitting: Hematology and Oncology

## 2014-07-14 VITALS — BP 166/60 | HR 54 | Temp 97.8°F | Resp 17 | Wt 121.1 lb

## 2014-07-14 DIAGNOSIS — D0510 Intraductal carcinoma in situ of unspecified breast: Secondary | ICD-10-CM

## 2014-07-14 DIAGNOSIS — Z853 Personal history of malignant neoplasm of breast: Secondary | ICD-10-CM | POA: Diagnosis not present

## 2014-07-14 DIAGNOSIS — D0511 Intraductal carcinoma in situ of right breast: Secondary | ICD-10-CM

## 2014-07-14 LAB — CBC WITH DIFFERENTIAL/PLATELET
BASO%: 0.3 % (ref 0.0–2.0)
BASOS ABS: 0 10*3/uL (ref 0.0–0.1)
EOS%: 2 % (ref 0.0–7.0)
Eosinophils Absolute: 0.1 10*3/uL (ref 0.0–0.5)
HCT: 42.6 % (ref 34.8–46.6)
HGB: 13.7 g/dL (ref 11.6–15.9)
LYMPH%: 32.5 % (ref 14.0–49.7)
MCH: 32.5 pg (ref 25.1–34.0)
MCHC: 32.3 g/dL (ref 31.5–36.0)
MCV: 100.6 fL (ref 79.5–101.0)
MONO#: 0.5 10*3/uL (ref 0.1–0.9)
MONO%: 12 % (ref 0.0–14.0)
NEUT#: 2.2 10*3/uL (ref 1.5–6.5)
NEUT%: 53.2 % (ref 38.4–76.8)
PLATELETS: 214 10*3/uL (ref 145–400)
RBC: 4.23 10*6/uL (ref 3.70–5.45)
RDW: 13 % (ref 11.2–14.5)
WBC: 4.1 10*3/uL (ref 3.9–10.3)
lymph#: 1.3 10*3/uL (ref 0.9–3.3)

## 2014-07-14 LAB — COMPREHENSIVE METABOLIC PANEL (CC13)
ALBUMIN: 4.1 g/dL (ref 3.5–5.0)
ALK PHOS: 75 U/L (ref 40–150)
ALT: 21 U/L (ref 0–55)
AST: 23 U/L (ref 5–34)
Anion Gap: 10 mEq/L (ref 3–11)
BUN: 16.5 mg/dL (ref 7.0–26.0)
CO2: 27 mEq/L (ref 22–29)
CREATININE: 0.7 mg/dL (ref 0.6–1.1)
Calcium: 9.2 mg/dL (ref 8.4–10.4)
Chloride: 105 mEq/L (ref 98–109)
EGFR: >90 mL/min/{1.73_m2} (ref >90)
Glucose: 98 mg/dl (ref 70–140)
Potassium: 4.5 mEq/L (ref 3.5–5.1)
Sodium: 142 mEq/L (ref 136–145)
Total Bilirubin: 0.41 mg/dL (ref 0.20–1.20)
Total Protein: 6.8 g/dL (ref 6.4–8.3)

## 2014-07-14 NOTE — Assessment & Plan Note (Signed)
Right breast DCIS diagnosed December 2011 status post lumpectomy 06/02/2010, genetic testing was negative for BRCA1 and 2, NSABP B 43 clinical trial participant, received adjuvant radiation therapy completed April 2012, took tamoxifen from April 2012 to September 2014 and discontinued due to side effects.  Breast cancer surveillance: 1. Breast examination 07/14/2014 is normal 2. Mammogram 08/19/2013 is normal  I discussed with her that the surveillance with the with annual mammograms and physical exams of the breast. Since she has done well and is not on any treatment currently, I recommended that we can transition her to survivorship clinic for next year.

## 2014-07-14 NOTE — Telephone Encounter (Signed)
Per 07/14/14 pof lab/fu 1 year survivorship clinic. Message to [place]. Patient aware [name] will contact her with appointments.

## 2014-07-14 NOTE — Progress Notes (Signed)
Patient Care Team: No Pcp Per Patient as PCP - General (General Practice)  DIAGNOSIS: No matching staging information was found for the patient.  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%, intermediate grade   06/09/2010 Procedure NASBP B-43 clinical trial participant since 06/09/2010   07/07/2010 - 08/17/2010 Radiation Therapy Adjuvant radiation therapy   08/14/2010 - 01/13/2013 Anti-estrogen oral therapy Tamoxifen 20 mg daily discontinued due to side effects    CHIEF COMPLIANT:   INTERVAL HISTORY: [name] is a     REVIEW OF SYSTEMS:   Constitutional: Denies fevers, chills or abnormal weight loss Eyes: Denies blurriness of vision Ears, nose, mouth, throat, and face: Denies mucositis or sore throat Respiratory: Denies cough, dyspnea or wheezes Cardiovascular: Denies palpitation, chest discomfort or lower extremity swelling Gastrointestinal:  Denies nausea, heartburn or change in bowel habits Skin: Denies abnormal skin rashes Lymphatics: Denies new lymphadenopathy or easy bruising Neurological:Denies numbness, tingling or new weaknesses Behavioral/Psych: Mood is stable, no new changes  Breast:  denies any pain or lumps or nodules in either breasts All other systems were reviewed with the patient and are negative.  I have reviewed the past medical history, past surgical history, social history and family history with the patient and they are unchanged from previous note.  ALLERGIES:  is allergic to metrogel; adhesive; and tamiflu.  MEDICATIONS:  Current Outpatient Prescriptions  Medication Sig Dispense Refill  . ALPRAZolam (XANAX PO) Take by mouth.    . ALPRAZolam (XANAX) 1 MG tablet Take 1 mg by mouth 2 (two) times daily.  5  .  amphetamine-dextroamphetamine (ADDERALL) 20 MG tablet Take 20 mg by mouth daily as needed. For attention/focus     . B Complex Vitamins (VITAMIN B COMPLEX PO) Take by mouth.    . calcium carbonate (OS-CAL) 600 MG TABS Take 600 mg by mouth daily.     . Multiple Vitamin (MULTIVITAMIN) capsule Take 1 capsule by mouth daily.    . NONFORMULARY OR COMPOUNDED ITEM Testosterone propionate petroleum (jar) 2% ointment.  Use 1/4 tsp three times weekly.  Disp: 60gm 1 each 2  . temazepam (RESTORIL) 7.5 MG capsule TAKE 1 TO 2 CAPSULES BY MOUTH EVERY EVENING AS NEEDED 60 capsule 3  . Testosterone Propionate 2 % OINT Testosterone propionate petroleum (jar) 2% ointment  Apply 1/4 teaspoon 3 x weekly 60 g 1  . UNABLE TO FIND Take 400 mg by mouth 2 (two) times daily. Ibruprofen    . zaleplon (SONATA) 10 MG capsule Take 10 mg by mouth at bedtime as needed. For sleep       No current facility-administered medications for this visit.    PHYSICAL EXAMINATION: ECOG PERFORMANCE STATUS: 0 - Asymptomatic  Filed Vitals:   07/14/14 1324  BP: 166/60  Pulse: 54  Temp: 97.8 F (36.6 C)  Resp: 17   Filed Weights   07/14/14 1324  Weight: 121 lb 1.6 oz (54.931 kg)    GENERAL:alert, no distress and comfortable SKIN: skin color, texture, turgor are normal, no rashes or significant lesions EYES: normal, Conjunctiva are pink and non-injected, sclera clear OROPHARYNX:no exudate, no erythema and lips, buccal mucosa, and tongue normal  NECK: supple, thyroid normal size, non-tender, without nodularity LYMPH:  no palpable lymphadenopathy in the cervical, axillary  or inguinal LUNGS: clear to auscultation and percussion with normal breathing effort HEART: regular rate & rhythm and no murmurs and no lower extremity edema ABDOMEN:abdomen soft, non-tender and normal bowel sounds Musculoskeletal:no cyanosis of digits and no clubbing  NEURO: alert & oriented x 3 with fluent speech, no focal motor/sensory deficits BREAST: No  palpable masses or nodules in either right or left breasts. No palpable axillary supraclavicular or infraclavicular adenopathy no breast tenderness or nipple discharge. (exam performed in the presence of a chaperone)  LABORATORY DATA:  I have reviewed the data as listed   Chemistry      Component Value Date/Time   NA 142 07/14/2014 1313   NA 138 12/11/2013 1340   K 4.5 07/14/2014 1313   K 4.3 12/11/2013 1340   CL 100 12/11/2013 1340   CL 104 01/09/2012 1342   CO2 27 07/14/2014 1313   CO2 30 12/11/2013 1340   BUN 16.5 07/14/2014 1313   BUN 15 12/11/2013 1340   CREATININE 0.7 07/14/2014 1313   CREATININE 0.77 12/11/2013 1340   CREATININE 0.71 03/27/2011 0816      Component Value Date/Time   CALCIUM 9.2 07/14/2014 1313   CALCIUM 9.7 12/11/2013 1340   ALKPHOS 75 07/14/2014 1313   ALKPHOS 67 12/11/2013 1340   AST 23 07/14/2014 1313   AST 30 12/11/2013 1340   ALT 21 07/14/2014 1313   ALT 28 12/11/2013 1340   BILITOT 0.41 07/14/2014 1313   BILITOT 0.8 12/11/2013 1340       Lab Results  Component Value Date   WBC 4.1 07/14/2014   HGB 13.7 07/14/2014   HCT 42.6 07/14/2014   MCV 100.6 07/14/2014   PLT 214 07/14/2014   NEUTROABS 2.2 07/14/2014     RADIOGRAPHIC STUDIES: I have personally reviewed the radiology reports and agreed with their findings. Mammogram 08/11/2013 normal  ASSESSMENT & PLAN:  Neoplasm of right breast, primary tumor staging category Tis: ductal carcinoma in situ (DCIS) Right breast DCIS diagnosed December 2011 status post lumpectomy 06/02/2010, genetic testing was negative for BRCA1 and 2, NSABP B 43 clinical trial participant, received adjuvant radiation therapy completed April 2012, took tamoxifen from April 2012 to September 2014 and discontinued due to side effects.  Breast cancer surveillance: 1. Breast examination 07/14/2014 is normal 2. Mammogram 08/19/2013 is normal  I discussed with her that the surveillance with the with annual mammograms  and physical exams of the breast. Since she has done well and is not on any treatment currently, I recommended that we can transition her to survivorship clinic for next year.    Orders Placed This Encounter  Procedures  . CBC with Differential    Standing Status: Future     Number of Occurrences:      Standing Expiration Date: 07/14/2015  . Comprehensive metabolic panel (Cmet) - [facility]    Standing Status: Future     Number of Occurrences:      Standing Expiration Date: 07/14/2015   The patient has a good understanding of the overall plan. she agrees with it. She will call with any problems that may develop before her next visit here.   Rulon Eisenmenger, MD

## 2014-07-15 ENCOUNTER — Ambulatory Visit (INDEPENDENT_AMBULATORY_CARE_PROVIDER_SITE_OTHER): Payer: Managed Care, Other (non HMO) | Admitting: Sports Medicine

## 2014-07-15 VITALS — BP 164/96 | HR 70 | Ht 68.0 in | Wt 120.0 lb

## 2014-07-15 DIAGNOSIS — R269 Unspecified abnormalities of gait and mobility: Secondary | ICD-10-CM

## 2014-07-15 DIAGNOSIS — M76821 Posterior tibial tendinitis, right leg: Secondary | ICD-10-CM
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
## 2014-08-03 ENCOUNTER — Other Ambulatory Visit: Payer: Self-pay | Admitting: *Deleted

## 2014-08-03 MED ORDER — NONFORMULARY OR COMPOUNDED ITEM: Status: DC

## 2014-08-03 NOTE — Telephone Encounter (Signed)
Medication refill request: Testosterone Oint Last AEX:  12/11/13 SM Next AEX: 01/05/15 SM Last MMG (if hormonal medication request): 04/20/14 BIRADS2:Bening  Refill authorized: 12/18/13 #1each/2R. Today please advise.

## 2014-10-02 ENCOUNTER — Other Ambulatory Visit: Payer: Self-pay | Admitting: Obstetrics & Gynecology

## 2014-10-02 NOTE — Telephone Encounter (Signed)
Medication refill request: temazepam 7.5mg  Last AEX:  12-11-13 Next AEX: 01-05-15 Last MMG (if hormonal medication request): 04-20-14 WNL Refill authorized: please advise

## 2014-10-06 ENCOUNTER — Encounter: Payer: Self-pay | Admitting: Neurology

## 2014-10-06 ENCOUNTER — Ambulatory Visit (INDEPENDENT_AMBULATORY_CARE_PROVIDER_SITE_OTHER): Payer: Managed Care, Other (non HMO) | Admitting: Neurology

## 2014-10-06 VITALS — BP 154/93 | HR 67 | Ht 68.0 in | Wt 120.0 lb

## 2014-10-06 DIAGNOSIS — G478 Other sleep disorders: Secondary | ICD-10-CM

## 2014-10-06 DIAGNOSIS — R61 Generalized hyperhidrosis: Secondary | ICD-10-CM | POA: Diagnosis not present

## 2014-10-06 DIAGNOSIS — R351 Nocturia: Secondary | ICD-10-CM

## 2014-10-06 DIAGNOSIS — R0683 Snoring: Secondary | ICD-10-CM

## 2014-10-06 DIAGNOSIS — G4719 Other hypersomnia: Secondary | ICD-10-CM | POA: Diagnosis not present

## 2014-10-06 DIAGNOSIS — R002 Palpitations: Secondary | ICD-10-CM | POA: Diagnosis not present
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
## 2014-10-06 NOTE — Patient Instructions (Addendum)
Based on your symptoms and your exam I believe you may be at risk for obstructive sleep apnea or OSA, and I think we should proceed with a sleep study to determine whether you do or do not have OSA and how severe it is. If you have more than mild OSA, I want you to consider treatment with CPAP. Please remember, the risks and ramifications of moderate to severe obstructive sleep apnea or OSA are: Cardiovascular disease, including congestive heart failure, stroke, difficult to control hypertension, arrhythmias, and even type 2 diabetes has been linked to untreated OSA. Sleep apnea causes disruption of sleep and sleep deprivation in most cases, which, in turn, can cause recurrent headaches, problems with memory, mood, concentration, focus, and vigilance. Most people with untreated sleep apnea report excessive daytime sleepiness, which can affect their ability to drive. Please do not drive if you feel sleepy.  I will see you back after your sleep study to go over the test results and where to go from there. We will call you after your sleep study and to set up an appointment at the time.   Our sleep lab administrative assistant, Angelina Sheriff will call you to schedule your sleep study. If you don't hear back from her by next week please feel free to call her at [phone]. This is her direct line and please leave a message with your phone number to call back if you get the voicemail box.

## 2014-10-20 ENCOUNTER — Telehealth: Payer: Self-pay

## 2014-10-20 NOTE — Telephone Encounter (Signed)
We are working on getting patient's sleep study approved, I have a few questions for the patient. I left message for her to call me back.

## 2014-10-28 NOTE — Telephone Encounter (Signed)
Patient never called back but I was able to find what I needed.

## 2014-11-03 ENCOUNTER — Encounter: Payer: Self-pay | Admitting: Genetic Counselor

## 2014-11-05 ENCOUNTER — Telehealth: Payer: Self-pay | Admitting: Neurology

## 2014-11-05 NOTE — Telephone Encounter (Signed)
OK to do so

## 2014-11-05 NOTE — Telephone Encounter (Signed)
HST approved due to sleep study in 2015.  Need order for HST and the split cancelled

## 2015-01-05 ENCOUNTER — Ambulatory Visit (INDEPENDENT_AMBULATORY_CARE_PROVIDER_SITE_OTHER): Payer: Managed Care, Other (non HMO) | Admitting: Obstetrics & Gynecology

## 2015-01-05 ENCOUNTER — Encounter: Payer: Self-pay | Admitting: Obstetrics & Gynecology

## 2015-01-05 VITALS — BP 156/88 | HR 56 | Resp 16 | Ht 67.75 in | Wt 121.0 lb

## 2015-01-05 DIAGNOSIS — R6882 Decreased libido: Secondary | ICD-10-CM

## 2015-01-05 DIAGNOSIS — Z0000 Encounter for general adult medical examination without abnormal findings: Secondary | ICD-10-CM

## 2015-01-05 DIAGNOSIS — K5732 Diverticulitis of large intestine without perforation or abscess without bleeding: Secondary | ICD-10-CM | POA: Diagnosis not present

## 2015-01-05 DIAGNOSIS — Z23 Encounter for immunization: Secondary | ICD-10-CM | POA: Diagnosis not present

## 2015-01-05 DIAGNOSIS — Z01419 Encounter for gynecological examination (general) (routine) without abnormal findings: Secondary | ICD-10-CM | POA: Diagnosis not present

## 2015-01-05 LAB — POCT URINALYSIS DIPSTICK
Bilirubin, UA: NEGATIVE
GLUCOSE UA: NEGATIVE
Ketones, UA: NEGATIVE
NITRITE UA: NEGATIVE
Protein, UA: NEGATIVE
RBC UA: NEGATIVE
Urobilinogen, UA: NEGATIVE
pH, UA: 5

## 2015-01-05 MED ORDER — AMOXICILLIN-POT CLAVULANATE 875-125 MG PO TABS: 1.0000 | ORAL_TABLET | Freq: Two times a day (BID) | ORAL | Status: DC
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
## 2015-01-05 NOTE — Patient Instructions (Signed)
Start checking BP in the morning.  If seeing values >140/80, need to call Dr. Alyson Ingles.

## 2015-01-06 ENCOUNTER — Telehealth: Payer: Self-pay | Admitting: Emergency Medicine

## 2015-01-06 DIAGNOSIS — Z853 Personal history of malignant neoplasm of breast: Secondary | ICD-10-CM

## 2015-01-06 LAB — CBC
HCT: 41.1 % (ref 36.0–46.0)
Hemoglobin: 13.3 g/dL (ref 12.0–15.0)
MCH: 32 pg (ref 26.0–34.0)
MCHC: 32.4 g/dL (ref 30.0–36.0)
MCV: 98.8 fL (ref 78.0–100.0)
MPV: 10.3 fL (ref 8.6–12.4)
Platelets: 224 10*3/uL (ref 150–400)
RBC: 4.16 MIL/uL (ref 3.87–5.11)
RDW: 13.3 % (ref 11.5–15.5)
WBC: 4.9 10*3/uL (ref 4.0–10.5)

## 2015-01-06 LAB — TESTOSTERONE: Testosterone: 39 ng/dL (ref 10–70)

## 2015-01-06 NOTE — Telephone Encounter (Signed)
Patient returning call.

## 2015-01-06 NOTE — Telephone Encounter (Signed)
Message left to return call to [name] at [phone].    

## 2015-01-06 NOTE — Telephone Encounter (Signed)
Call to patient. Message from Dr. Sabra Heck given. Patient states she has taken two doses of Augmentin and feels slightly improved. Patient advised to continue with Augmentin as directed.  No fevers.  She is advised to call back as planned early Friday morning to give update. She agrees and will do so.   Will keep encounter open so patient can return call.   Update to Dr. Sabra Heck

## 2015-01-06 NOTE — Telephone Encounter (Signed)
Will you hold this for Friday?

## 2015-01-06 NOTE — Telephone Encounter (Signed)
Hold for update for 01/08/15

## 2015-01-06 NOTE — Telephone Encounter (Signed)
-----   Message from Megan Salon, MD sent at 01/06/2015  7:34 AM EDT ----- Inform CBC normal.  Pt was going to start Augmentin yesterday.  H/O transverse colon diverticulitis and pt having same symptoms.  She is to give update Friday morning, early, to let me know in case she isn't much better and needs to get in with GI.  Also let her know testosterone level was normal.  Thanks.

## 2015-01-07 NOTE — Telephone Encounter (Signed)
-----   Message from Megan Salon, MD sent at 01/05/2015  2:44 PM EDT ----- Regarding: MRi Mrs. Arras is getting every other year MRIs and didn't go in 2014 and I advised of this last year but she didn't call for scheduling.  She wants [place] to set this up for her.  Going to [place] in Early October so will do after she returns.  Can you work on this?  Vinnie Level

## 2015-01-08 ENCOUNTER — Other Ambulatory Visit: Payer: Self-pay | Admitting: Obstetrics & Gynecology

## 2015-01-08 DIAGNOSIS — C50911 Malignant neoplasm of unspecified site of right female breast: Secondary | ICD-10-CM

## 2015-01-08 NOTE — Telephone Encounter (Signed)
Thank you.  Ok to close encounter. 

## 2015-01-08 NOTE — Telephone Encounter (Signed)
Call to patient. She states she chose to stop the Augmentin because it was giving her nausea and flu like symptoms. She stopped last night and is feeling much better. She denies fevers or worsening abdominal pain. Advised will send to Dr. Sabra Heck to advise if needs further treatment, patient states she does feel much improved.    Patient okay to wait until December to have Mammogram and Breast MRI.  Scheduled mammogram for patient 04/22/15 at 0900 at [facility].  Will send orders to [facility] for Breast MRI to schedule in January.   Called patient back, detailed message left to advise of mammogram appointment at [facility]. Advised diagnostic mammogram per radiology report from 03/2014 with diagnostic bilateral recommended due to breast cancer history.  Advised patient [facility] will call her to schedule  Breast MRI after mammogram appointment.

## 2015-04-01 ENCOUNTER — Telehealth: Payer: Self-pay | Admitting: Obstetrics & Gynecology

## 2015-04-01 NOTE — Telephone Encounter (Signed)
Spoke with patient. Advised her wellness form has been signed and ready for pick up. Patient is agreeable and will come by the office today to pick up form.  Routing to provider for final review. Patient agreeable to disposition. Will close encounter.

## 2015-04-01 NOTE — Telephone Encounter (Signed)
This patient dropped off a short wellness form that only requires a provider's signature. Routing to Dr. Sabra Heck to sign. Patient requests a call back to pick up once complete.

## 2015-04-01 NOTE — Telephone Encounter (Signed)
Left message to call [place] at [phone].  Wellness form has been signed by [place]. Placed in an envelop with her name and DOB at the front for pick up.

## 2015-04-22 ENCOUNTER — Ambulatory Visit
Admission: RE | Admit: 2015-04-22 | Discharge: 2015-04-22 | Disposition: A | Discharge status: Home / Self Care | Payer: Managed Care, Other (non HMO) | Source: Ambulatory Visit | Attending: Obstetrics & Gynecology | Admitting: Obstetrics & Gynecology

## 2015-04-22 DIAGNOSIS — C50911 Malignant neoplasm of unspecified site of right female breast: Secondary | ICD-10-CM

## 2015-04-23 ENCOUNTER — Other Ambulatory Visit: Payer: Self-pay | Admitting: Obstetrics & Gynecology

## 2015-04-27 ENCOUNTER — Other Ambulatory Visit: Payer: Self-pay | Admitting: Obstetrics & Gynecology

## 2015-04-27 ENCOUNTER — Other Ambulatory Visit: Payer: Managed Care, Other (non HMO)

## 2015-04-27 ENCOUNTER — Encounter: Payer: Self-pay | Admitting: Obstetrics & Gynecology

## 2015-04-27 ENCOUNTER — Telehealth: Payer: Self-pay | Admitting: Obstetrics & Gynecology

## 2015-04-27 NOTE — Telephone Encounter (Signed)
[employer] called to say that they denied patients approval for procedure that Dr. Sabra Heck wants patient to have. They will fax [place] more information in regards to this. [id number] # [id number]; Case # [id number]

## 2015-04-27 NOTE — Telephone Encounter (Signed)
[facility] is calling requesting to speak with a staff member about this patient's appointment with them tonight. They have not contacted the patient yet but are having trouble with the patient's insurance prior authorization.

## 2015-04-27 NOTE — Telephone Encounter (Signed)
Call to [facility] to speak with nurse reviewer. Updated clinical information given to nurse reviewer. They have two business days to give final authorization/denial.   Discussed History with nurse reviewer.   Recent mammogram 04/22/15- Category C Breast Density., breasts heterogeneously dense.   Neoplasm of right breast, primary tumor staging category Tis: ductal carcinoma in situ (DCIS)  05/13/2010 Procedure Genetics testing for BRCA1 and BRCA2 gene mutation were negative on 05/13/2010  06/02/2010 Surgery Right breast DCIS 0.8 cm, ER 98%, PR 93%, intermediate grade  06/09/2010 Procedure NASBP B-43 clinical trial participant since 06/09/2010  07/07/2010 - 08/17/2010 Radiation Therapy Adjuvant radiation therapy  08/14/2010 - 01/13/2013 Anti-estrogen oral therapy Tamoxifen 20 mg daily discontinued due to side effects  Maternal history of breast cancer at age 8.

## 2015-04-27 NOTE — Telephone Encounter (Signed)
Call to patient. Advised that Breast MRI will need to be delayed until approval received from [facility]. Patient agreeable.  Advised per Christella Scheuermann we will have final approval/denial in two business days.  Patient agreeable to reschedule for next week so that authorization can be obtained and appeals be placed if necessary.  MRI rescheduled to 05/05/15 at 1530.  Return call to patient to give her updated appointment information, left detailed message, okay per designated party release form.

## 2015-04-27 NOTE — Telephone Encounter (Signed)
Called and spoke with [name] at [facility]. Requesting to find out if denial was made before or after my call to [facility].  She states that this case status is currently in reconsideration for physician review. The phone call with denial was made as follow up phone call prior to requesting the initial review.  Routing update to Dr. Sabra Heck, may need appeal if physician review is denied.

## 2015-04-28 NOTE — Telephone Encounter (Signed)
Dr. Sabra Heck,  I received information from [facility] that reconsideration of MRI was denied.  The request now needs to go to Peer to Peer.  Can you call for Peer to Peer Review:  Call [phone].  Choose option 4.  Case ID is [id number] Patient ID is [id number] Patients MRI appointment is now scheduled for 05/05/15

## 2015-04-30 NOTE — Telephone Encounter (Signed)
Left message for patient to call [facility] at [phone].  Spoke with [place] at [facility]. Advised imaging has been approved for appointment on 05/05/15. Authorization code provided.

## 2015-04-30 NOTE — Telephone Encounter (Signed)
Authorization code for breast MRI:  [id number].  Approved for breast MRI.  Please let pt know.

## 2015-05-03 NOTE — Telephone Encounter (Signed)
Spoke with patient. Advised of bilateral breast MRI approval. Patient is agreeable and will keep her appointment as scheduled for 05/05/2015 at [facility].  Routing to provider for final review. Patient agreeable to disposition. Will close encounter.

## 2015-05-05 ENCOUNTER — Ambulatory Visit
Admission: RE | Admit: 2015-05-05 | Discharge: 2015-05-05 | Disposition: A | Discharge status: Home / Self Care | Payer: Managed Care, Other (non HMO) | Source: Ambulatory Visit | Attending: Obstetrics & Gynecology | Admitting: Obstetrics & Gynecology

## 2015-05-05 DIAGNOSIS — Z853 Personal history of malignant neoplasm of breast: Secondary | ICD-10-CM

## 2015-05-05 MED ORDER — GADOBENATE DIMEGLUMINE 529 MG/ML IV SOLN
10.0000 mL | Freq: Once | INTRAVENOUS | Status: AC | PRN
  Administered 2015-05-05: 10 mL via INTRAVENOUS

## 2015-05-10 ENCOUNTER — Telehealth: Payer: Self-pay | Admitting: Emergency Medicine

## 2015-05-10 NOTE — Telephone Encounter (Signed)
-----   Message from Megan Salon, MD sent at 05/07/2015 10:44 AM EST ----- Please let pt know her breast MRI is negative.  Out of MMG hold.

## 2015-05-10 NOTE — Telephone Encounter (Signed)
Call to patient. Advised of results from Dr. Sabra Heck. Patient verbalized understanding.  Will follow up prn.  Routing to provider for final review. Patient agreeable to disposition. Will close encounter.    Out of hold.

## 2015-05-13 ENCOUNTER — Other Ambulatory Visit: Payer: Self-pay | Admitting: *Deleted

## 2015-05-13 MED ORDER — NONFORMULARY OR COMPOUNDED ITEM: Status: DC

## 2015-05-13 NOTE — Telephone Encounter (Signed)
Medication refill request: Testosterone Last AEX:  01-05-15 Next AEX: 05-11-16 Last MMG (if hormonal medication request): [id number] Refill authorized: please advise

## 2015-05-21 ENCOUNTER — Ambulatory Visit (INDEPENDENT_AMBULATORY_CARE_PROVIDER_SITE_OTHER): Payer: Managed Care, Other (non HMO) | Admitting: Cardiovascular Disease

## 2015-05-21 ENCOUNTER — Encounter: Payer: Self-pay | Admitting: Cardiovascular Disease

## 2015-05-21 VITALS — BP 136/80 | HR 55 | Ht 67.0 in | Wt 127.0 lb

## 2015-05-21 DIAGNOSIS — R002 Palpitations: Secondary | ICD-10-CM | POA: Diagnosis not present

## 2015-05-21 LAB — TSH: TSH: 2.832 u[IU]/mL (ref 0.350–4.500)

## 2015-05-21 NOTE — Patient Instructions (Signed)
Medication Instructions:  Your physician recommends that you continue on your current medications as directed. Please refer to the Current Medication list given to you today.   Labwork: TODAY - TSH   Testing/Procedures: Your physician has recommended that you wear an event monitor. Event monitors are medical devices that record the heart's electrical activity. Doctors most often [place] these monitors to diagnose arrhythmias. Arrhythmias are problems with the speed or rhythm of the heartbeat. The monitor is a small, portable device. You can wear one while you do your normal daily activities. This is usually used to diagnose what is causing palpitations/syncope (passing out).   Follow-Up: Your physician recommends that you schedule a follow-up appointment in: 3 months with Dr. Acie Fredrickson.    If you need a refill on your cardiac medications before your next appointment, please call your pharmacy.   Thank you for choosing [facility]! Christen Bame, RN [phone]
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
## 2015-05-25 ENCOUNTER — Ambulatory Visit (INDEPENDENT_AMBULATORY_CARE_PROVIDER_SITE_OTHER): Payer: Managed Care, Other (non HMO)

## 2015-05-25 DIAGNOSIS — R002 Palpitations: Secondary | ICD-10-CM | POA: Diagnosis not present

## 2015-06-07 ENCOUNTER — Telehealth: Payer: Self-pay | Admitting: *Deleted

## 2015-06-07 NOTE — Telephone Encounter (Signed)
Lm for pt  TO CALL BACK  RE SERIOUS  MONITOR  NOTIFICATION WANTING TO KNOW   PT'S  ACTIVITY  DURING TIME  OF  RECORDING .[name]

## 2015-06-07 NOTE — Telephone Encounter (Signed)
DISCUSSED   RECORDING WITH   PT .PER PT WAS DOING    NORMAL   ACTIVITY ONLY WITH SAID  RECORDING.  PT  WAS HOWEVER GETTING READY FOR RELAY   THAT LASTED  3 HOURS PT  STATED  NOTED 3  EPISODES   WITH   NAUSEA  OCCURING   1 TIME BETWEEN   10:00 -1;00. PT  WANTED TO KNOW  WHAT THOSE  EPISODES  SHOWED   DISCUSSED  WITH SHELLY  AND   MONIT[employer]ING COMPANY CALLED  AS  WELL WITH  NO  RECORDINGS  AVAILABLE   MONITORING CO  TO CALL PT   AND  RUN  TEST  ON EQUIPMENT WILL SHOW  DR  NAHSER EPISODE .Adonis Housekeeper

## 2015-06-28 ENCOUNTER — Telehealth: Payer: Self-pay | Admitting: Cardiovascular Disease

## 2015-06-28 NOTE — Telephone Encounter (Signed)
Returning your call. °

## 2015-06-28 NOTE — Telephone Encounter (Signed)
Cardiac event monitor results reviewed with patient who verbalized understanding.  Per Dr. Acie Fredrickson, she does not have to return for 3 month follow-up but can call for appointment as needed.  She thanked me for the call.

## 2015-08-19 ENCOUNTER — Ambulatory Visit: Payer: Managed Care, Other (non HMO) | Admitting: Cardiovascular Disease

## 2015-09-18 ENCOUNTER — Telehealth: Payer: Self-pay | Admitting: Nurse Practitioner

## 2015-09-18 NOTE — Telephone Encounter (Signed)
Spoke with patient re 09/27/15 LTS visit.

## 2015-09-27 ENCOUNTER — Telehealth: Payer: Self-pay | Admitting: Nurse Practitioner

## 2015-09-27 ENCOUNTER — Ambulatory Visit (HOSPITAL_BASED_OUTPATIENT_CLINIC_OR_DEPARTMENT_OTHER): Payer: Managed Care, Other (non HMO) | Admitting: Nurse Practitioner

## 2015-09-27 ENCOUNTER — Encounter: Payer: Self-pay | Admitting: Nurse Practitioner

## 2015-09-27 VITALS — BP 152/82 | HR 52 | Temp 97.8°F | Resp 18

## 2015-09-27 DIAGNOSIS — D0511 Intraductal carcinoma in situ of right breast: Secondary | ICD-10-CM

## 2015-09-27 DIAGNOSIS — Z86000 Personal history of in-situ neoplasm of breast: Secondary | ICD-10-CM

## 2015-09-27 NOTE — Patient Instructions (Signed)
Thank you for coming in today!  As we discussed, please continue to perform your self breast exam and report any changes. If you note any new symptoms (please see below), be sure to notify us ASAP.  Your mammogram will be due in December 2017 and we will enter orders for it today.  We'll have you return in one year's time for your next appointment or sooner if you have any problems. Please be sure to stop by scheduling on your way out to make those appointment(s).  Please keep an eye on your blood pressure and let us know if it remains elevated.  Looking forward to working with you in the future!  Let us know if you have any questions and good luck with your upcoming triathlon!!  Symptoms to Watch for and Report to Your Provider  . Return of the cancer symptoms you had before- such as a lump or new growth where your cancer first started . New or unusual pain that seems unrelated to an injury and does not go away, including back pain or bone pain . Weight loss without trying/intending . Unexplained bleeding . A rash or allergic reaction, such as swelling, severe itching or wheezing . Chills or fevers . Persistent headaches . Shortness of breath or difficulty breathing . Bloody stools or blood in your urine . Lumps, bumps, swelling and/or nipple discharge . Nausea, vomiting, diarrhea, loss of appetite, or trouble swallowing . A cough that doesn't go away . Abdominal pain . Swelling in your arms or legs . Fractures . Hot flashes or other menopausal symptoms . Any other signs mentioned by your doctor or nurse or any unusual symptoms                 that you just can't explain   NOTE: Just because you have certain symptoms, it doesn't mean the cancer has come back or you have a new cancer. Symptoms can be due to other problems that need to be addressed.  It is important to watch for these symptoms and report them to your provider so you can be medically evaluated for any of these concerns!      Living a Life of Wellness After Cancer:  *Note: Please consult your health care provider before using any medications, supplements, over-the-counter products, or other interventions.  Also, please consult your primary care provider before you begin any lifestyle program (diet, exercise, etc.).  Your safety is our top priority and we want to make sure you continue to live a long and healthy life!    Healthy Lifestyle Recommendations  As a cancer survivor, it is important develop a lifelong commitment to a healthy lifestyle. A healthy lifestyle can prevent cancer from returning as well as prevent other diseases like heart disease, diabetes and high blood pressure.  These are some things that you can do to have a healthy lifestyle:  Marland Kitchen Maintain a healthy weight.  . Exercise daily per your doctor's orders. . Eat a balanced diet high in fruits, vegetables, bran, and fiber. Limit intake of red meat      and processed foods.  . Limit how much alcohol you consume, if at all. Ali Lowe regular bone mineral density testing for osteoporosis.  . Talk to your doctor about cardiovascular disease or "heart disease" screening. . Stop smoking (if you smoke). . Know your family history. . Be mindful of your emotional, social, and spiritual needs. . Meet regularly with a Primary Care Provider (PCP). Find a PCP  if you do not             already have one. . Talk to your doctor about regular cancer screening including screening for colon           cancer, GYN cancers, and skin cancer.
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
## 2015-09-27 NOTE — Telephone Encounter (Signed)
per pof to sch pt appt-pt req to ake own appt with breast Center-gave avs

## 2015-10-15 ENCOUNTER — Encounter: Payer: Self-pay | Admitting: Genetic Counselor

## 2015-11-11 DIAGNOSIS — M9902 Segmental and somatic dysfunction of thoracic region: Secondary | ICD-10-CM | POA: Diagnosis not present

## 2015-11-11 DIAGNOSIS — M9903 Segmental and somatic dysfunction of lumbar region: Secondary | ICD-10-CM | POA: Diagnosis not present

## 2015-11-11 DIAGNOSIS — M9905 Segmental and somatic dysfunction of pelvic region: Secondary | ICD-10-CM | POA: Diagnosis not present

## 2015-11-11 DIAGNOSIS — M797 Fibromyalgia: Secondary | ICD-10-CM | POA: Diagnosis not present

## 2015-11-16 DIAGNOSIS — M9905 Segmental and somatic dysfunction of pelvic region: Secondary | ICD-10-CM | POA: Diagnosis not present

## 2015-11-16 DIAGNOSIS — M9903 Segmental and somatic dysfunction of lumbar region: Secondary | ICD-10-CM | POA: Diagnosis not present

## 2015-11-16 DIAGNOSIS — M797 Fibromyalgia: Secondary | ICD-10-CM | POA: Diagnosis not present

## 2015-11-16 DIAGNOSIS — M9902 Segmental and somatic dysfunction of thoracic region: Secondary | ICD-10-CM | POA: Diagnosis not present

## 2015-12-07 DIAGNOSIS — M797 Fibromyalgia: Secondary | ICD-10-CM | POA: Diagnosis not present

## 2015-12-07 DIAGNOSIS — M9905 Segmental and somatic dysfunction of pelvic region: Secondary | ICD-10-CM | POA: Diagnosis not present

## 2015-12-07 DIAGNOSIS — M9903 Segmental and somatic dysfunction of lumbar region: Secondary | ICD-10-CM | POA: Diagnosis not present

## 2015-12-07 DIAGNOSIS — M9902 Segmental and somatic dysfunction of thoracic region: Secondary | ICD-10-CM | POA: Diagnosis not present

## 2015-12-14 DIAGNOSIS — M9903 Segmental and somatic dysfunction of lumbar region: Secondary | ICD-10-CM | POA: Diagnosis not present

## 2015-12-14 DIAGNOSIS — M9905 Segmental and somatic dysfunction of pelvic region: Secondary | ICD-10-CM | POA: Diagnosis not present

## 2015-12-14 DIAGNOSIS — M9902 Segmental and somatic dysfunction of thoracic region: Secondary | ICD-10-CM | POA: Diagnosis not present

## 2015-12-14 DIAGNOSIS — M797 Fibromyalgia: Secondary | ICD-10-CM | POA: Diagnosis not present

## 2015-12-20 DIAGNOSIS — M797 Fibromyalgia: Secondary | ICD-10-CM | POA: Diagnosis not present

## 2015-12-20 DIAGNOSIS — M9902 Segmental and somatic dysfunction of thoracic region: Secondary | ICD-10-CM | POA: Diagnosis not present

## 2015-12-20 DIAGNOSIS — M9903 Segmental and somatic dysfunction of lumbar region: Secondary | ICD-10-CM | POA: Diagnosis not present

## 2015-12-20 DIAGNOSIS — M9905 Segmental and somatic dysfunction of pelvic region: Secondary | ICD-10-CM | POA: Diagnosis not present

## 2015-12-28 DIAGNOSIS — R799 Abnormal finding of blood chemistry, unspecified: Secondary | ICD-10-CM | POA: Diagnosis not present

## 2015-12-28 DIAGNOSIS — Z13220 Encounter for screening for lipoid disorders: Secondary | ICD-10-CM | POA: Diagnosis not present

## 2016-01-14 DIAGNOSIS — M9902 Segmental and somatic dysfunction of thoracic region: Secondary | ICD-10-CM | POA: Diagnosis not present

## 2016-01-14 DIAGNOSIS — M797 Fibromyalgia: Secondary | ICD-10-CM | POA: Diagnosis not present

## 2016-01-14 DIAGNOSIS — M9903 Segmental and somatic dysfunction of lumbar region: Secondary | ICD-10-CM | POA: Diagnosis not present

## 2016-01-14 DIAGNOSIS — M9905 Segmental and somatic dysfunction of pelvic region: Secondary | ICD-10-CM | POA: Diagnosis not present

## 2016-01-20 DIAGNOSIS — M9905 Segmental and somatic dysfunction of pelvic region: Secondary | ICD-10-CM | POA: Diagnosis not present

## 2016-01-20 DIAGNOSIS — M9902 Segmental and somatic dysfunction of thoracic region: Secondary | ICD-10-CM | POA: Diagnosis not present

## 2016-01-20 DIAGNOSIS — M797 Fibromyalgia: Secondary | ICD-10-CM | POA: Diagnosis not present

## 2016-01-20 DIAGNOSIS — M9903 Segmental and somatic dysfunction of lumbar region: Secondary | ICD-10-CM | POA: Diagnosis not present

## 2016-02-03 DIAGNOSIS — Z23 Encounter for immunization: Secondary | ICD-10-CM | POA: Diagnosis not present

## 2016-02-21 ENCOUNTER — Telehealth: Payer: Self-pay | Admitting: Obstetrics & Gynecology

## 2016-02-21 DIAGNOSIS — R6882 Decreased libido: Secondary | ICD-10-CM

## 2016-02-21 DIAGNOSIS — M797 Fibromyalgia: Secondary | ICD-10-CM | POA: Diagnosis not present

## 2016-02-21 DIAGNOSIS — M9903 Segmental and somatic dysfunction of lumbar region: Secondary | ICD-10-CM | POA: Diagnosis not present

## 2016-02-21 DIAGNOSIS — M9902 Segmental and somatic dysfunction of thoracic region: Secondary | ICD-10-CM | POA: Diagnosis not present

## 2016-02-21 DIAGNOSIS — M9905 Segmental and somatic dysfunction of pelvic region: Secondary | ICD-10-CM | POA: Diagnosis not present

## 2016-02-21 NOTE — Telephone Encounter (Addendum)
Patient requesting refill for Testosterone cream Last AEX 12/26/14, Next AEX 05/11/16 Last testosterone level 01/05/15 Last MMG 04/22/15

## 2016-02-21 NOTE — Telephone Encounter (Signed)
Patient called to inquire about a denial for a refill on testosterone cream. Patient states she is leaving to go out of town on Wednesday,  02/23/16 and will gone for 2 weeks. Patient states will need refill before heading out of town. Advised patient will forward to triage for review. Patient is agreeable to a return call.  Routing to Triage for review.

## 2016-02-21 NOTE — Telephone Encounter (Signed)
Patient is calling to see if her refill for testosterone can be called into [facility]. She states they will need a day to get this filled. She is leaving to go out of town on Wednesday.

## 2016-02-23 ENCOUNTER — Other Ambulatory Visit: Payer: Self-pay | Admitting: Obstetrics and Gynecology

## 2016-02-23 ENCOUNTER — Other Ambulatory Visit: Payer: BLUE CROSS/BLUE SHIELD

## 2016-02-23 DIAGNOSIS — R6882 Decreased libido: Secondary | ICD-10-CM | POA: Diagnosis not present

## 2016-02-23 MED ORDER — NONFORMULARY OR COMPOUNDED ITEM: 0 refills | Status: DC

## 2016-02-23 NOTE — Telephone Encounter (Signed)
Will close encounter

## 2016-02-23 NOTE — Telephone Encounter (Addendum)
Spoke with patient. Advised patient of Dr. Gentry Fitz recommendations. Patient needs to come in for testosterone level today; last level drawn 01/05/15. Dr. Talbert Nan will call in lowest amount of testosterone until levels are back. Patient scheduled for 11:40 am lab appt. Patient verbalizes understanding and is agreeable.    Lab order placed for testosterone

## 2016-02-23 NOTE — Telephone Encounter (Signed)
Prescription faxed to [facility] [phone].    Dr. Talbert Nan, ok to close encounter?  Cc: Dr. Sabra Heck

## 2016-02-23 NOTE — Telephone Encounter (Signed)
Spoke with patient. Patient states she is going out of town today and needs this prescription-see below. Advised patient Dr. Sabra Heck out of office today, will speak with covering provider and return call. Patient is agreeable.   Dr. Talbert Nan please advise on Testosterone cream.  Cc: Dr. Sabra Heck

## 2016-02-25 LAB — TESTOSTERONE, TOTAL, LC/MS/MS: TESTOSTERONE, TOTAL, LC-MS-MS: 12 ng/dL (ref 2–45)

## 2016-02-27 LAB — TESTOSTERONE, FREE, LC/MS/MS: TESTOSTERONE, FREEE, LC/MS/MS: 0.7 pg/mL (ref 0.2–5.0)

## 2016-03-22 DIAGNOSIS — H40013 Open angle with borderline findings, low risk, bilateral: Secondary | ICD-10-CM | POA: Diagnosis not present

## 2016-03-23 DIAGNOSIS — G4709 Other insomnia: Secondary | ICD-10-CM | POA: Diagnosis not present

## 2016-03-23 DIAGNOSIS — F900 Attention-deficit hyperactivity disorder, predominantly inattentive type: Secondary | ICD-10-CM | POA: Diagnosis not present

## 2016-04-11 DIAGNOSIS — L812 Freckles: Secondary | ICD-10-CM | POA: Diagnosis not present

## 2016-04-11 DIAGNOSIS — L821 Other seborrheic keratosis: Secondary | ICD-10-CM | POA: Diagnosis not present

## 2016-04-11 DIAGNOSIS — B078 Other viral warts: Secondary | ICD-10-CM | POA: Diagnosis not present

## 2016-04-11 DIAGNOSIS — L570 Actinic keratosis: Secondary | ICD-10-CM | POA: Diagnosis not present

## 2016-04-11 DIAGNOSIS — L723 Sebaceous cyst: Secondary | ICD-10-CM | POA: Diagnosis not present

## 2016-04-11 DIAGNOSIS — D225 Melanocytic nevi of trunk: Secondary | ICD-10-CM | POA: Diagnosis not present

## 2016-04-12 DIAGNOSIS — M9905 Segmental and somatic dysfunction of pelvic region: Secondary | ICD-10-CM | POA: Diagnosis not present

## 2016-04-12 DIAGNOSIS — M9902 Segmental and somatic dysfunction of thoracic region: Secondary | ICD-10-CM | POA: Diagnosis not present

## 2016-04-12 DIAGNOSIS — M9903 Segmental and somatic dysfunction of lumbar region: Secondary | ICD-10-CM | POA: Diagnosis not present

## 2016-04-12 DIAGNOSIS — M797 Fibromyalgia: Secondary | ICD-10-CM | POA: Diagnosis not present

## 2016-04-13 DIAGNOSIS — M1612 Unilateral primary osteoarthritis, left hip: Secondary | ICD-10-CM | POA: Diagnosis not present

## 2016-04-18 DIAGNOSIS — M1612 Unilateral primary osteoarthritis, left hip: Secondary | ICD-10-CM | POA: Diagnosis not present

## 2016-04-25 ENCOUNTER — Ambulatory Visit
Admission: RE | Admit: 2016-04-25 | Discharge: 2016-04-25 | Disposition: A | Discharge status: Home / Self Care | Payer: BLUE CROSS/BLUE SHIELD | Source: Ambulatory Visit | Attending: Nurse Practitioner | Admitting: Nurse Practitioner

## 2016-04-25 DIAGNOSIS — D0511 Intraductal carcinoma in situ of right breast: Secondary | ICD-10-CM

## 2016-04-25 DIAGNOSIS — R922 Inconclusive mammogram: Secondary | ICD-10-CM | POA: Diagnosis not present

## 2016-04-26 DIAGNOSIS — M797 Fibromyalgia: Secondary | ICD-10-CM | POA: Diagnosis not present

## 2016-04-26 DIAGNOSIS — M9903 Segmental and somatic dysfunction of lumbar region: Secondary | ICD-10-CM | POA: Diagnosis not present

## 2016-04-26 DIAGNOSIS — M9905 Segmental and somatic dysfunction of pelvic region: Secondary | ICD-10-CM | POA: Diagnosis not present

## 2016-04-26 DIAGNOSIS — M9902 Segmental and somatic dysfunction of thoracic region: Secondary | ICD-10-CM | POA: Diagnosis not present

## 2016-05-01 DIAGNOSIS — M797 Fibromyalgia: Secondary | ICD-10-CM | POA: Diagnosis not present

## 2016-05-01 DIAGNOSIS — M9902 Segmental and somatic dysfunction of thoracic region: Secondary | ICD-10-CM | POA: Diagnosis not present

## 2016-05-01 DIAGNOSIS — M9903 Segmental and somatic dysfunction of lumbar region: Secondary | ICD-10-CM | POA: Diagnosis not present

## 2016-05-01 DIAGNOSIS — M9905 Segmental and somatic dysfunction of pelvic region: Secondary | ICD-10-CM | POA: Diagnosis not present

## 2016-05-04 ENCOUNTER — Encounter: Payer: Self-pay | Admitting: Obstetrics & Gynecology

## 2016-05-09 DIAGNOSIS — M9903 Segmental and somatic dysfunction of lumbar region: Secondary | ICD-10-CM | POA: Diagnosis not present

## 2016-05-09 DIAGNOSIS — M9902 Segmental and somatic dysfunction of thoracic region: Secondary | ICD-10-CM | POA: Diagnosis not present

## 2016-05-09 DIAGNOSIS — M9905 Segmental and somatic dysfunction of pelvic region: Secondary | ICD-10-CM | POA: Diagnosis not present

## 2016-05-09 DIAGNOSIS — M797 Fibromyalgia: Secondary | ICD-10-CM | POA: Diagnosis not present

## 2016-05-11 ENCOUNTER — Ambulatory Visit: Payer: Managed Care, Other (non HMO) | Admitting: Obstetrics & Gynecology
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
## 2016-05-16 ENCOUNTER — Ambulatory Visit (INDEPENDENT_AMBULATORY_CARE_PROVIDER_SITE_OTHER): Payer: BLUE CROSS/BLUE SHIELD | Admitting: Obstetrics & Gynecology

## 2016-05-16 ENCOUNTER — Other Ambulatory Visit: Payer: Self-pay | Admitting: Obstetrics & Gynecology

## 2016-05-16 ENCOUNTER — Encounter: Payer: Self-pay | Admitting: Obstetrics & Gynecology

## 2016-05-16 VITALS — BP 120/80 | HR 62 | Resp 12 | Ht 67.75 in | Wt 125.0 lb

## 2016-05-16 DIAGNOSIS — E2839 Other primary ovarian failure: Secondary | ICD-10-CM

## 2016-05-16 DIAGNOSIS — Z205 Contact with and (suspected) exposure to viral hepatitis: Secondary | ICD-10-CM | POA: Diagnosis not present

## 2016-05-16 DIAGNOSIS — Z01419 Encounter for gynecological examination (general) (routine) without abnormal findings: Secondary | ICD-10-CM

## 2016-05-16 DIAGNOSIS — Z124 Encounter for screening for malignant neoplasm of cervix: Secondary | ICD-10-CM | POA: Diagnosis not present

## 2016-05-16 DIAGNOSIS — Z0000 Encounter for general adult medical examination without abnormal findings: Secondary | ICD-10-CM

## 2016-05-16 DIAGNOSIS — Z1151 Encounter for screening for human papillomavirus (HPV): Secondary | ICD-10-CM | POA: Diagnosis not present

## 2016-05-16 DIAGNOSIS — M1612 Unilateral primary osteoarthritis, left hip: Secondary | ICD-10-CM | POA: Diagnosis not present

## 2016-05-16 LAB — CBC WITH DIFFERENTIAL/PLATELET
BASOS ABS: 0 {cells}/uL (ref 0–200)
Basophils Relative: 0 %
EOS ABS: 74 {cells}/uL (ref 15–500)
Eosinophils Relative: 2 %
HEMATOCRIT: 42.1 % (ref 35.0–45.0)
Hemoglobin: 13.8 g/dL (ref 11.7–15.5)
LYMPHS PCT: 44 %
Lymphs Abs: 1628 cells/uL (ref 850–3900)
MCH: 32.6 pg (ref 27.0–33.0)
MCHC: 32.8 g/dL (ref 32.0–36.0)
MCV: 99.5 fL (ref 80.0–100.0)
MONO ABS: 444 {cells}/uL (ref 200–950)
MONOS PCT: 12 %
MPV: 9.5 fL (ref 7.5–12.5)
Neutro Abs: 1554 cells/uL (ref 1500–7800)
Neutrophils Relative %: 42 %
PLATELETS: 225 10*3/uL (ref 140–400)
RBC: 4.23 MIL/uL (ref 3.80–5.10)
RDW: 13 % (ref 11.0–15.0)
WBC: 3.7 10*3/uL — ABNORMAL LOW (ref 3.8–10.8)

## 2016-05-16 LAB — COMPREHENSIVE METABOLIC PANEL
ALK PHOS: 67 U/L (ref 33–130)
ALT: 14 U/L (ref 6–29)
AST: 22 U/L (ref 10–35)
Albumin: 4.1 g/dL (ref 3.6–5.1)
BUN: 19 mg/dL (ref 7–25)
CO2: 29 mmol/L (ref 20–31)
CREATININE: 0.78 mg/dL (ref 0.50–1.05)
Calcium: 9.6 mg/dL (ref 8.6–10.4)
Chloride: 103 mmol/L (ref 98–110)
Glucose, Bld: 86 mg/dL (ref 65–99)
POTASSIUM: 4.7 mmol/L (ref 3.5–5.3)
SODIUM: 140 mmol/L (ref 135–146)
TOTAL PROTEIN: 6.6 g/dL (ref 6.1–8.1)
Total Bilirubin: 0.6 mg/dL (ref 0.2–1.2)

## 2016-05-16 LAB — POCT URINALYSIS DIPSTICK
Bilirubin, UA: NEGATIVE
Blood, UA: NEGATIVE
Glucose, UA: NEGATIVE
Ketones, UA: NEGATIVE
Leukocytes, UA: NEGATIVE
Nitrite, UA: NEGATIVE
Protein, UA: NEGATIVE
UROBILINOGEN UA: NEGATIVE
pH, UA: 6

## 2016-05-16 LAB — HEPATITIS C ANTIBODY: HCV Ab: NEGATIVE

## 2016-05-16 LAB — LIPID PANEL
Cholesterol: 219 mg/dL — ABNORMAL HIGH (ref <200)
HDL: 97 mg/dL (ref >50)
LDL CALC: 110 mg/dL — AB (ref <100)
Total CHOL/HDL Ratio: 2.3 Ratio (ref <5.0)
Triglycerides: 61 mg/dL (ref <150)
VLDL: 12 mg/dL (ref <30)

## 2016-05-16 LAB — TSH: TSH: 3.17 m[IU]/L

## 2016-05-16 MED ORDER — NONFORMULARY OR COMPOUNDED ITEM: 5 refills | Status: DC

## 2016-05-16 NOTE — Progress Notes (Signed)
Prescription for testosterone propionate petroleum ointment- #1,5RF faxed to [facility]. Fax #: [phone].

## 2016-05-16 NOTE — Progress Notes (Signed)
POCT Urine Dip = all negative, pH 6.0

## 2016-05-16 NOTE — Addendum Note (Signed)
Addended by: Abelino Derrick C on: 05/16/2016 11:25 AM   Modules accepted: Orders

## 2016-05-17 LAB — THYROID PEROXIDASE ANTIBODY: Thyroperoxidase Ab SerPl-aCnc: <1 IU/mL (ref <9)

## 2016-05-18 LAB — IPS PAP TEST WITH HPV

## 2016-05-31 ENCOUNTER — Other Ambulatory Visit: Payer: Self-pay | Admitting: Orthopedic Surgery

## 2016-05-31 DIAGNOSIS — M1612 Unilateral primary osteoarthritis, left hip: Secondary | ICD-10-CM

## 2016-06-07 ENCOUNTER — Ambulatory Visit
Admission: RE | Admit: 2016-06-07 | Discharge: 2016-06-07 | Disposition: A | Discharge status: Home / Self Care | Payer: BLUE CROSS/BLUE SHIELD | Source: Ambulatory Visit | Attending: Orthopedic Surgery | Admitting: Orthopedic Surgery

## 2016-06-07 DIAGNOSIS — M25552 Pain in left hip: Secondary | ICD-10-CM | POA: Diagnosis not present

## 2016-06-07 DIAGNOSIS — M1612 Unilateral primary osteoarthritis, left hip: Secondary | ICD-10-CM

## 2016-06-15 DIAGNOSIS — M1612 Unilateral primary osteoarthritis, left hip: Secondary | ICD-10-CM | POA: Diagnosis not present

## 2016-06-22 HISTORY — PX: TOTAL HIP ARTHROPLASTY: SHX124

## 2016-07-05 ENCOUNTER — Ambulatory Visit (INDEPENDENT_AMBULATORY_CARE_PROVIDER_SITE_OTHER): Payer: BLUE CROSS/BLUE SHIELD | Admitting: Orthopaedic Surgery

## 2016-07-05 DIAGNOSIS — M25552 Pain in left hip: Secondary | ICD-10-CM | POA: Diagnosis not present

## 2016-07-05 DIAGNOSIS — M1612 Unilateral primary osteoarthritis, left hip: Secondary | ICD-10-CM | POA: Diagnosis not present
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
## 2016-07-06 DIAGNOSIS — M1612 Unilateral primary osteoarthritis, left hip: Secondary | ICD-10-CM | POA: Diagnosis not present

## 2016-07-11 ENCOUNTER — Encounter: Payer: Self-pay | Admitting: *Deleted

## 2016-07-11 NOTE — Progress Notes (Signed)
Clearance form from from surgical center of [facility] for outpatient joint replacement procedure scheduled for 07/18/16 signed by Dr. Acie Fredrickson and placed in nurse fax bin in medical records to be faxed.

## 2016-07-13 DIAGNOSIS — Z01818 Encounter for other preprocedural examination: Secondary | ICD-10-CM | POA: Diagnosis not present

## 2016-07-13 DIAGNOSIS — M25852 Other specified joint disorders, left hip: Secondary | ICD-10-CM | POA: Diagnosis not present

## 2016-07-18 DIAGNOSIS — Z96651 Presence of right artificial knee joint: Secondary | ICD-10-CM | POA: Diagnosis not present

## 2016-07-18 DIAGNOSIS — M1612 Unilateral primary osteoarthritis, left hip: Secondary | ICD-10-CM | POA: Diagnosis not present

## 2016-07-19 ENCOUNTER — Ambulatory Visit (INDEPENDENT_AMBULATORY_CARE_PROVIDER_SITE_OTHER): Payer: BLUE CROSS/BLUE SHIELD | Admitting: Orthopaedic Surgery

## 2016-07-24 DIAGNOSIS — M1612 Unilateral primary osteoarthritis, left hip: Secondary | ICD-10-CM | POA: Diagnosis not present

## 2016-08-02 DIAGNOSIS — M1612 Unilateral primary osteoarthritis, left hip: Secondary | ICD-10-CM | POA: Diagnosis not present

## 2016-08-09 DIAGNOSIS — M1612 Unilateral primary osteoarthritis, left hip: Secondary | ICD-10-CM | POA: Diagnosis not present

## 2016-08-16 DIAGNOSIS — M1612 Unilateral primary osteoarthritis, left hip: Secondary | ICD-10-CM | POA: Diagnosis not present

## 2016-09-20 DIAGNOSIS — F900 Attention-deficit hyperactivity disorder, predominantly inattentive type: Secondary | ICD-10-CM | POA: Diagnosis not present

## 2016-09-20 DIAGNOSIS — G4709 Other insomnia: Secondary | ICD-10-CM | POA: Diagnosis not present

## 2016-09-26 ENCOUNTER — Encounter: Payer: BLUE CROSS/BLUE SHIELD | Admitting: Adult Health

## 2016-09-26 ENCOUNTER — Encounter: Payer: Managed Care, Other (non HMO) | Admitting: Nurse Practitioner
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
## 2016-10-17 ENCOUNTER — Ambulatory Visit (HOSPITAL_BASED_OUTPATIENT_CLINIC_OR_DEPARTMENT_OTHER): Payer: BLUE CROSS/BLUE SHIELD | Admitting: Adult Health

## 2016-10-17 VITALS — BP 145/79 | HR 53 | Temp 97.5°F | Resp 18 | Ht 68.0 in | Wt 124.3 lb

## 2016-10-17 DIAGNOSIS — D0511 Intraductal carcinoma in situ of right breast: Secondary | ICD-10-CM

## 2016-10-17 DIAGNOSIS — Z86000 Personal history of in-situ neoplasm of breast: Secondary | ICD-10-CM

## 2016-10-18 ENCOUNTER — Encounter: Payer: Self-pay | Admitting: Adult Health

## 2016-11-09 ENCOUNTER — Other Ambulatory Visit: Payer: BLUE CROSS/BLUE SHIELD

## 2016-11-09 ENCOUNTER — Encounter: Payer: Self-pay | Admitting: Genetics

## 2016-11-09 ENCOUNTER — Ambulatory Visit (HOSPITAL_BASED_OUTPATIENT_CLINIC_OR_DEPARTMENT_OTHER): Payer: BLUE CROSS/BLUE SHIELD | Admitting: Genetics

## 2016-11-09 DIAGNOSIS — D0511 Intraductal carcinoma in situ of right breast: Secondary | ICD-10-CM

## 2016-11-09 DIAGNOSIS — M9905 Segmental and somatic dysfunction of pelvic region: Secondary | ICD-10-CM | POA: Diagnosis not present

## 2016-11-09 DIAGNOSIS — Z7183 Encounter for nonprocreative genetic counseling: Secondary | ICD-10-CM | POA: Diagnosis not present

## 2016-11-09 DIAGNOSIS — Z800 Family history of malignant neoplasm of digestive organs: Secondary | ICD-10-CM | POA: Diagnosis not present

## 2016-11-09 DIAGNOSIS — Z1379 Encounter for other screening for genetic and chromosomal anomalies: Secondary | ICD-10-CM | POA: Insufficient documentation

## 2016-11-09 DIAGNOSIS — Z803 Family history of malignant neoplasm of breast: Secondary | ICD-10-CM

## 2016-11-09 DIAGNOSIS — M9903 Segmental and somatic dysfunction of lumbar region: Secondary | ICD-10-CM | POA: Diagnosis not present

## 2016-11-09 DIAGNOSIS — Z808 Family history of malignant neoplasm of other organs or systems: Secondary | ICD-10-CM | POA: Insufficient documentation

## 2016-11-09 DIAGNOSIS — M9902 Segmental and somatic dysfunction of thoracic region: Secondary | ICD-10-CM | POA: Diagnosis not present

## 2016-11-09 DIAGNOSIS — M797 Fibromyalgia: Secondary | ICD-10-CM | POA: Diagnosis not present
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
## 2016-11-16 ENCOUNTER — Other Ambulatory Visit: Payer: Self-pay | Admitting: *Deleted

## 2016-11-16 DIAGNOSIS — M797 Fibromyalgia: Secondary | ICD-10-CM | POA: Diagnosis not present

## 2016-11-16 DIAGNOSIS — M9903 Segmental and somatic dysfunction of lumbar region: Secondary | ICD-10-CM | POA: Diagnosis not present

## 2016-11-16 DIAGNOSIS — M9902 Segmental and somatic dysfunction of thoracic region: Secondary | ICD-10-CM | POA: Diagnosis not present

## 2016-11-16 DIAGNOSIS — M9905 Segmental and somatic dysfunction of pelvic region: Secondary | ICD-10-CM | POA: Diagnosis not present

## 2016-11-16 NOTE — Telephone Encounter (Signed)
Medication refill request: testosterone compounded  Last AEX:  05/16/16 SM Next AEX: 07/27/17 SM  Last MMG (if hormonal medication request): 04/25/16 BIRADS2:benign  Refill authorized: 05/16/16 #1each/5R. Today please advise.

## 2016-11-17 ENCOUNTER — Telehealth: Payer: Self-pay | Admitting: Genetics

## 2016-11-17 MED ORDER — NONFORMULARY OR COMPOUNDED ITEM: 2 refills | Status: DC

## 2016-11-20 NOTE — Telephone Encounter (Signed)
Revealed negative genetic testing.  Discussed that we do not know why she has breast cancer or why there is cancer in the family. It could be due to a different gene that we are not testing, or maybe our current technology may not be able to pick something up.  Her cancer could also be unrelated to her brother's colon cancer and there is a hereditary cause for his cancer that she did not inherit.   It will be important for her to keep in contact with genetics to keep up with whether additional testing may be needed.  Discussed that her daughter may still be at increased risk for cancer and should have screening at 56 or 10 years earlier than youngest breast cancer (41).  Also recommended that her mother, siblings, nieces/neqhews, anyone closely related to her deceased brother with colon cancer at 32 have genetic counseling and genetic testing.

## 2016-11-20 NOTE — Telephone Encounter (Signed)
Rx faxed today 

## 2016-11-21 ENCOUNTER — Ambulatory Visit: Payer: Self-pay | Admitting: Genetics

## 2016-11-21 ENCOUNTER — Encounter: Payer: Self-pay | Admitting: Genetics

## 2016-11-21 DIAGNOSIS — D0511 Intraductal carcinoma in situ of right breast: Secondary | ICD-10-CM

## 2016-11-21 DIAGNOSIS — M797 Fibromyalgia: Secondary | ICD-10-CM | POA: Diagnosis not present

## 2016-11-21 DIAGNOSIS — M9903 Segmental and somatic dysfunction of lumbar region: Secondary | ICD-10-CM | POA: Diagnosis not present

## 2016-11-21 DIAGNOSIS — Z803 Family history of malignant neoplasm of breast: Secondary | ICD-10-CM

## 2016-11-21 DIAGNOSIS — M9905 Segmental and somatic dysfunction of pelvic region: Secondary | ICD-10-CM | POA: Diagnosis not present

## 2016-11-21 DIAGNOSIS — M9902 Segmental and somatic dysfunction of thoracic region: Secondary | ICD-10-CM | POA: Diagnosis not present

## 2016-11-21 DIAGNOSIS — Z800 Family history of malignant neoplasm of digestive organs: Secondary | ICD-10-CM

## 2016-11-21 NOTE — Progress Notes (Signed)
HPI: Ms. Renner was previously seen in the [facility] on 11/09/2016 due to a personal history of breast cancer, family history of breast and colon cancer, and concerns regarding a hereditary predisposition to cancer. Please refer to our prior cancer genetics clinic note for more information regarding Ms. Arther's medical, social and family histories, and our assessment and recommendations, at the time. Ms. Zappone's recent genetic test results were disclosed to her, as well as recommendations warranted by these results. These results and recommendations are discussed in more detail below.  CANCER HISTORY:    Neoplasm of right breast, primary tumor staging category Tis: ductal carcinoma in situ (DCIS)   05/13/2010 Procedure    Genetics testing for BRCA1 and BRCA2 gene mutation were negative on 05/13/2010      06/02/2010 Pathologic Stage    Stage 0: Tis N0      06/02/2010 Surgery    Right lumpectomy: DCIS 0.8 cm, ER 98%, PR 93%, intermediate grade      06/09/2010 Procedure    NSABP B-43 clinical trial participant since 06/09/2010      07/07/2010 - 08/17/2010 Radiation Therapy    Adjuvant radiation therapy      08/14/2010 - 01/13/2013 Anti-estrogen oral therapy    Tamoxifen 20 mg daily discontinued due to side effects      11/16/2016 Genetic Testing    Updated genetic testing. Patient has genetic testing done for the [facility] Multi-Cancers panel. The Multi-Cancer Panel offered by [facility] includes sequencing and/or deletion duplication testing of the following 83 genes: ALK, APC, ATM, AXIN2,BAP1,  BARD1, BLM, BMPR1A, BRCA1, BRCA2, BRIP1, CASR, CDC73, CDH1, CDK4, CDKN1B, CDKN1C, CDKN2A (p14ARF), CDKN2A (p16INK4a), CEBPA, CHEK2, CTNNA1, DICER1, DIS3L2, EGFR (c.2369C>T, p.Thr790Met variant only), EPCAM (Deletion/duplication testing only), FH, FLCN, GATA2, GPC3, GREM1 (Promoter region deletion/duplication testing only), HOXB13 (c.251G>A, p.Gly84Glu), HRAS, KIT, MAX, MEN1, MET, MITF  (c.952G>A, p.Glu318Lys variant only), MLH1, MSH2, MSH3, MSH6, MUTYH, NBN, NF1, NF2, NTHL1, PALB2, PDGFRA, PHOX2B, PMS2, POLD1, POLE, POT1, PRKAR1A, PTCH1, PTEN, RAD50, RAD51C, RAD51D, RB1, RECQL4, RET, RUNX1, SDHAF2, SDHA (sequence changes only), SDHB, SDHC, SDHD, SMAD4, SMARCA4, SMARCB1, SMARCE1, STK11, SUFU, TERC, TERT, TMEM127, TP53, TSC1, TSC2, VHL, WRN and WT1.   Results revealed patient has the following mutation(s): No pathogenic mutations identified in the 83 genes tested.  The date of this updated report is 11/16/2016.        FAMILY HISTORY:  We obtained a detailed, 4-generation family history.  Significant diagnoses are listed below: Family History  Problem Relation Age of Onset  . Colon cancer Mother 83       separate cancer (not met)  . Breast cancer Mother 2       inflammatory  . Lung cancer Mother 20  . Heart failure Father        pacer, defibrilator  . Breast cancer Father   . Melanoma Father 29  . Colon cancer Brother 40       died at 65  . Throat cancer Paternal Grandfather        died in his early 74's  . Melanoma Brother 45   Ms. Flippo has a 81 year-old daughter and a 4 year-old son who have no children or history of cancer.  Ms. Marrocco has 3 brothers and 1 sister listed below: -1 brother was diagnosed with colon cancer at 12.  He died at the age of 31.  He has 2 daughters and a son who are in their 20's with no history of cancer.  -  1 brother is 36 and had melanoma in his 33's.  He has a son and a daughter in their 1's with no history of cancer.  -1 brother is 43 and has no children or any history of cancer.  -1 sister is 69 and has a 69 year-old daughter.  No history of cancer for these relatives.   Ms. Giroux's father died at 3 due to heart failure.  He had melanoma in his 54's and slow-growing prostate cancer in his 8's.  He had a maternal half brother (patient's half-uncle) who died in his 45's and had no history of cancer.  This uncle had 2 sons, but no  information is know about them.  Ms. Wisner's paternal grandfather died in his early 71's and had throat cancer.  He was an only child and there is no other cancer history known on his side of the family.  Ms. Cancel's paternal grandmother died in her early 61's and had no history of cancer.  She had 2 sisters and a brother who all lived to their 47's and had no cancer.   Ms. Meth mother is 2 and was diagnosed with breast cancer at 58.  She was diagnosed with colon cancer (separate cancer,not met) a few years later.  Shortly after that she was diagnosed with lung cancer in her late 30's.  Ms. Renn has a maternal aunt who died at 52 and had no history of cancer.  This aunt had 2 sons and 3 daughters who are in their 44's with no history of cancer.  Ms. Loya's maternal grandfather died at 46 due to heart issues.  There is no known cancer on his side of the family.  Ms. Doleman's maternal grandmother died at 83 and had no history of cancer.  She had a sister, but no information is known about her health.     Ms. Mas is unaware of previous family history of genetic testing for hereditary cancer risks. Patient's maternal ancestors are [place] descent, and paternal ancestors are of German/English descent. There is No reported Ashkenazi Jewish ancestry. There is No known consanguinity.  GENETIC TEST RESULTS: Genetic testing performed through [facility]'s Multi-Cancer Panel reported out on 11/16/2016 showed no pathogenic mutations. The Multi-Cancer Panel offered by [facility] includes sequencing and/or deletion duplication testing of the following 83 genes: ALK, APC, ATM, AXIN2,BAP1,  BARD1, BLM, BMPR1A, BRCA1, BRCA2, BRIP1, CASR, CDC73, CDH1, CDK4, CDKN1B, CDKN1C, CDKN2A (p14ARF), CDKN2A (p16INK4a), CEBPA, CHEK2, CTNNA1, DICER1, DIS3L2, EGFR (c.2369C>T, p.Thr790Met variant only), EPCAM (Deletion/duplication testing only), FH, FLCN, GATA2, GPC3, GREM1 (Promoter region deletion/duplication testing only), HOXB13  (c.251G>A, p.Gly84Glu), HRAS, KIT, MAX, MEN1, MET, MITF (c.952G>A, p.Glu318Lys variant only), MLH1, MSH2, MSH3, MSH6, MUTYH, NBN, NF1, NF2, NTHL1, PALB2, PDGFRA, PHOX2B, PMS2, POLD1, POLE, POT1, PRKAR1A, PTCH1, PTEN, RAD50, RAD51C, RAD51D, RB1, RECQL4, RET, RUNX1, SDHAF2, SDHA (sequence changes only), SDHB, SDHC, SDHD, SMAD4, SMARCA4, SMARCB1, SMARCE1, STK11, SUFU, TERC, TERT, TMEM127, TP53, TSC1, TSC2, VHL, WRN and WT1. .  The test report will be scanned into EPIC and will be located under the Molecular Pathology section of the Results Review tab.A portion of the result report is included below for reference.     We discussed with Ms. Saar that because current genetic testing is not perfect, it is possible there may be a gene mutation in one of these genes that current testing cannot detect, but that chance is small. We also discussed, that there could be another gene that has not yet been discovered, or that we have  not yet tested, that is responsible for the cancer diagnoses in the family. Furthermore there could be a hereditary cause for her brother's colon cancer or the other family history of cancer that she did not inherit. Therefore, it is important to remain in touch with cancer genetics in the future so that we can continue to offer Ms. Klee the most up to date genetic testing.   ADDITIONAL GENETIC TESTING: We discussed with Ms. Trudel that her genetic testing was fairly extensive.  If there are other genes associated with increased cancer risk that can be analyzed in the future, we would be happy to discuss and coordinate testing at that time.     CANCER SCREENING RECOMMENDATIONS:  This result indicates that it is unlikely Ms. Mcnulty has an increased risk for a future cancer due to a mutation in one of these genes. This normal test also suggests that Ms. Dante's cancer was most likely not due to an inherited predisposition associated with one of these genes.  Most cancers happen by  chance and this negative test suggests that her cancer may fall into this category.  Therefore, it is recommended she continue to follow the cancer management and screening guidelines provided by her oncology and primary healthcare providers.  Other factors such as her personal and family history may still affect her cancer risk.  Because we cannot explain the cause for her brother or mother's colon cancer, she should follow increased colon cancer screening recommendations based on her family history from her GI physicians.  Current NCCN recommendations for individuals with a first degree relative with colon cancer are listed below:     RECOMMENDATIONS FOR FAMILY MEMBERS: Women in this family might be at some increased risk of developing cancer, over the general population risk, simply due to the family history of cancer. We recommended women in this family have a yearly mammogram beginning at age 67, or 87 years younger than the earliest onset of cancer, an annual clinical breast exam, and perform monthly breast self-exams. Women in this family should also have a gynecological exam as recommended by their primary provider.   All first degree relatives of her brother and mother (who had colon cancer) are recommended to have increased colon cancer screening.  It is recommended that her siblings, and her brother's (with colon cancer) children start colon screening at 77 (10 years earlier than the youngest colon cancer diagnosis), and repeat every 5 years or as recommended by their gastroenterologist. Other relatives should have colonoscopies by the age of 34 (or younger depending of their specific family history).     Based on Ms. Bradt's family history, we recommended her other siblings, mother with colon, breast, and lung cancer, and her brother with colon cancer's children, have genetic counseling and testing. Ms. Mcmasters will let us know if we can be of any assistance in coordinating genetic counseling  and/or testing for this family member.   FOLLOW-UP: Lastly, we discussed with Ms. Bettes that cancer genetics is a rapidly advancing field and it is possible that new genetic tests will be appropriate for her and/or her family members in the future. We encouraged her to remain in contact with cancer genetics on an annual basis so we can update her personal and family histories and let her know of advances in cancer genetics that may benefit this family.   Our contact number was provided. Ms. Senat's questions were answered to her satisfaction, and she knows she is welcome to call us at  anytime with additional questions or concerns.   Ferol Luz, MS Genetic Counselor [email or web address]

## 2016-12-06 DIAGNOSIS — M9903 Segmental and somatic dysfunction of lumbar region: Secondary | ICD-10-CM | POA: Diagnosis not present

## 2016-12-06 DIAGNOSIS — M797 Fibromyalgia: Secondary | ICD-10-CM | POA: Diagnosis not present

## 2016-12-06 DIAGNOSIS — M9902 Segmental and somatic dysfunction of thoracic region: Secondary | ICD-10-CM | POA: Diagnosis not present

## 2016-12-06 DIAGNOSIS — M9905 Segmental and somatic dysfunction of pelvic region: Secondary | ICD-10-CM | POA: Diagnosis not present

## 2016-12-12 DIAGNOSIS — M9903 Segmental and somatic dysfunction of lumbar region: Secondary | ICD-10-CM | POA: Diagnosis not present

## 2016-12-12 DIAGNOSIS — M9902 Segmental and somatic dysfunction of thoracic region: Secondary | ICD-10-CM | POA: Diagnosis not present

## 2016-12-12 DIAGNOSIS — M797 Fibromyalgia: Secondary | ICD-10-CM | POA: Diagnosis not present

## 2016-12-12 DIAGNOSIS — M9905 Segmental and somatic dysfunction of pelvic region: Secondary | ICD-10-CM | POA: Diagnosis not present

## 2016-12-22 ENCOUNTER — Ambulatory Visit
Admission: RE | Admit: 2016-12-22 | Discharge: 2016-12-22 | Disposition: A | Discharge status: Home / Self Care | Payer: BLUE CROSS/BLUE SHIELD | Source: Ambulatory Visit | Attending: Obstetrics & Gynecology | Admitting: Obstetrics & Gynecology

## 2016-12-22 DIAGNOSIS — M85851 Other specified disorders of bone density and structure, right thigh: Secondary | ICD-10-CM | POA: Diagnosis not present

## 2016-12-22 DIAGNOSIS — E2839 Other primary ovarian failure: Secondary | ICD-10-CM

## 2016-12-22 DIAGNOSIS — Z780 Asymptomatic menopausal state: Secondary | ICD-10-CM | POA: Diagnosis not present

## 2017-01-04 DIAGNOSIS — M9905 Segmental and somatic dysfunction of pelvic region: Secondary | ICD-10-CM | POA: Diagnosis not present

## 2017-01-04 DIAGNOSIS — M9903 Segmental and somatic dysfunction of lumbar region: Secondary | ICD-10-CM | POA: Diagnosis not present

## 2017-01-04 DIAGNOSIS — M797 Fibromyalgia: Secondary | ICD-10-CM | POA: Diagnosis not present

## 2017-01-04 DIAGNOSIS — M9902 Segmental and somatic dysfunction of thoracic region: Secondary | ICD-10-CM | POA: Diagnosis not present

## 2017-01-05 DIAGNOSIS — M9905 Segmental and somatic dysfunction of pelvic region: Secondary | ICD-10-CM | POA: Diagnosis not present

## 2017-01-05 DIAGNOSIS — M797 Fibromyalgia: Secondary | ICD-10-CM | POA: Diagnosis not present

## 2017-01-05 DIAGNOSIS — M9902 Segmental and somatic dysfunction of thoracic region: Secondary | ICD-10-CM | POA: Diagnosis not present

## 2017-01-05 DIAGNOSIS — M9903 Segmental and somatic dysfunction of lumbar region: Secondary | ICD-10-CM | POA: Diagnosis not present

## 2017-02-07 DIAGNOSIS — Z23 Encounter for immunization: Secondary | ICD-10-CM | POA: Diagnosis not present

## 2017-03-21 DIAGNOSIS — G4700 Insomnia, unspecified: Secondary | ICD-10-CM | POA: Diagnosis not present

## 2017-03-21 DIAGNOSIS — F900 Attention-deficit hyperactivity disorder, predominantly inattentive type: Secondary | ICD-10-CM | POA: Diagnosis not present

## 2017-04-13 DIAGNOSIS — L570 Actinic keratosis: Secondary | ICD-10-CM | POA: Diagnosis not present

## 2017-04-13 DIAGNOSIS — L821 Other seborrheic keratosis: Secondary | ICD-10-CM | POA: Diagnosis not present

## 2017-04-13 DIAGNOSIS — D225 Melanocytic nevi of trunk: Secondary | ICD-10-CM | POA: Diagnosis not present

## 2017-04-13 DIAGNOSIS — B070 Plantar wart: Secondary | ICD-10-CM | POA: Diagnosis not present

## 2017-04-13 DIAGNOSIS — L720 Epidermal cyst: Secondary | ICD-10-CM | POA: Diagnosis not present

## 2017-04-13 DIAGNOSIS — L812 Freckles: Secondary | ICD-10-CM | POA: Diagnosis not present

## 2017-04-26 DIAGNOSIS — M9905 Segmental and somatic dysfunction of pelvic region: Secondary | ICD-10-CM | POA: Diagnosis not present

## 2017-04-26 DIAGNOSIS — M9903 Segmental and somatic dysfunction of lumbar region: Secondary | ICD-10-CM | POA: Diagnosis not present

## 2017-04-26 DIAGNOSIS — M797 Fibromyalgia: Secondary | ICD-10-CM | POA: Diagnosis not present

## 2017-04-26 DIAGNOSIS — M9902 Segmental and somatic dysfunction of thoracic region: Secondary | ICD-10-CM | POA: Diagnosis not present

## 2017-04-27 ENCOUNTER — Telehealth: Payer: Self-pay | Admitting: Obstetrics & Gynecology

## 2017-04-27 NOTE — Telephone Encounter (Signed)
Patient request that her mammogram order be changed from diagnostic to screening. Patient states her insurance charges her more for diagnostic.

## 2017-04-27 NOTE — Telephone Encounter (Signed)
Spoke with patient. Patient requesting to change MMG from diagnostic to screening. Advised order for diagnostic MMG placed by Day, NP. Patient states she is still being followed by oncology.    Recommended patient f/u with [facility] regarding recommendations for imaging. May return call to [facility] if any additional assistance needed. Patient verbalizes understanding and is agreeable.   Routing to provider for final review. Patient is agreeable to disposition. Will close encounter.

## 2017-04-30 ENCOUNTER — Ambulatory Visit
Admission: RE | Admit: 2017-04-30 | Discharge: 2017-04-30 | Disposition: A | Discharge status: Home / Self Care | Payer: BLUE CROSS/BLUE SHIELD | Source: Ambulatory Visit | Attending: Adult Health | Admitting: Adult Health

## 2017-04-30 DIAGNOSIS — D0511 Intraductal carcinoma in situ of right breast: Secondary | ICD-10-CM

## 2017-04-30 DIAGNOSIS — R922 Inconclusive mammogram: Secondary | ICD-10-CM | POA: Diagnosis not present

## 2017-05-01 DIAGNOSIS — M9905 Segmental and somatic dysfunction of pelvic region: Secondary | ICD-10-CM | POA: Diagnosis not present

## 2017-05-01 DIAGNOSIS — M797 Fibromyalgia: Secondary | ICD-10-CM | POA: Diagnosis not present

## 2017-05-01 DIAGNOSIS — M9903 Segmental and somatic dysfunction of lumbar region: Secondary | ICD-10-CM | POA: Diagnosis not present

## 2017-05-01 DIAGNOSIS — M9902 Segmental and somatic dysfunction of thoracic region: Secondary | ICD-10-CM | POA: Diagnosis not present

## 2017-05-02 DIAGNOSIS — J018 Other acute sinusitis: Secondary | ICD-10-CM | POA: Diagnosis not present

## 2017-05-03 ENCOUNTER — Ambulatory Visit (HOSPITAL_COMMUNITY): Admission: RE | Admit: 2017-05-03 | Payer: BLUE CROSS/BLUE SHIELD | Source: Ambulatory Visit

## 2017-05-08 ENCOUNTER — Ambulatory Visit
Admission: RE | Admit: 2017-05-08 | Discharge: 2017-05-08 | Disposition: A | Discharge status: Home / Self Care | Payer: BLUE CROSS/BLUE SHIELD | Source: Ambulatory Visit | Attending: Family Medicine | Admitting: Family Medicine

## 2017-05-08 ENCOUNTER — Other Ambulatory Visit: Payer: Self-pay | Admitting: Family Medicine

## 2017-05-08 DIAGNOSIS — R05 Cough: Secondary | ICD-10-CM | POA: Diagnosis not present

## 2017-05-08 DIAGNOSIS — Z853 Personal history of malignant neoplasm of breast: Secondary | ICD-10-CM | POA: Diagnosis not present

## 2017-05-08 DIAGNOSIS — R5383 Other fatigue: Secondary | ICD-10-CM | POA: Diagnosis not present

## 2017-05-14 DIAGNOSIS — R5383 Other fatigue: Secondary | ICD-10-CM | POA: Diagnosis not present

## 2017-05-14 DIAGNOSIS — R05 Cough: Secondary | ICD-10-CM | POA: Diagnosis not present

## 2017-05-15 DIAGNOSIS — H40013 Open angle with borderline findings, low risk, bilateral: Secondary | ICD-10-CM | POA: Diagnosis not present

## 2017-05-21 ENCOUNTER — Ambulatory Visit (INDEPENDENT_AMBULATORY_CARE_PROVIDER_SITE_OTHER): Payer: BLUE CROSS/BLUE SHIELD | Admitting: Family

## 2017-05-21 ENCOUNTER — Other Ambulatory Visit: Payer: Self-pay | Admitting: Family

## 2017-05-21 ENCOUNTER — Encounter: Payer: Self-pay | Admitting: Family

## 2017-05-21 VITALS — BP 194/96 | HR 57 | Temp 97.3°F | Wt 127.0 lb

## 2017-05-21 DIAGNOSIS — R5383 Other fatigue: Secondary | ICD-10-CM | POA: Diagnosis not present

## 2017-05-21 DIAGNOSIS — R030 Elevated blood-pressure reading, without diagnosis of hypertension: Secondary | ICD-10-CM

## 2017-05-21 NOTE — Assessment & Plan Note (Signed)
Blood pressure elevated today likely related to discomfort. Advised to continue to monitor blood pressure at home with potential need to decrease Adderall if taking. Follow up with PCP if remains elevated.
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
## 2017-05-21 NOTE — Assessment & Plan Note (Signed)
Continues to experience residual symptoms of fatigue and occasional generalized achiness. There does not appear to be any active bacterial process, and likely viral with some improvements since initial onset. We will check her Monospot, B12, and iron panel in relation to the fatigue. Cannot rule out underlying depression, otherwise she appears very healthy. Continue supportive care. Consider additional imaging or blood cultures if symptoms do not improve. I am happy to see her back as needed pending blood work.

## 2017-05-21 NOTE — Patient Instructions (Signed)
Very nice to meet you!  We will check you blood work today.  It appears as this is a viral process with no additional need for antibiotics.  Recommend a bowel regimen to help alleviate potential constipation.   Please follow up if your symptoms worsen or do not improve.

## 2017-05-22 ENCOUNTER — Ambulatory Visit (HOSPITAL_COMMUNITY): Admission: RE | Admit: 2017-05-22 | Payer: BLUE CROSS/BLUE SHIELD | Source: Ambulatory Visit

## 2017-05-22 ENCOUNTER — Encounter: Payer: Self-pay | Admitting: Family

## 2017-05-22 ENCOUNTER — Ambulatory Visit
Admission: RE | Admit: 2017-05-22 | Discharge: 2017-05-22 | Disposition: A | Discharge status: Home / Self Care | Payer: BLUE CROSS/BLUE SHIELD | Source: Ambulatory Visit | Attending: Gastroenterology | Admitting: Gastroenterology

## 2017-05-22 ENCOUNTER — Other Ambulatory Visit: Payer: Self-pay | Admitting: Gastroenterology

## 2017-05-22 DIAGNOSIS — R1084 Generalized abdominal pain: Secondary | ICD-10-CM

## 2017-05-22 DIAGNOSIS — K5732 Diverticulitis of large intestine without perforation or abscess without bleeding: Secondary | ICD-10-CM | POA: Diagnosis not present

## 2017-05-22 DIAGNOSIS — R1032 Left lower quadrant pain: Secondary | ICD-10-CM | POA: Diagnosis not present

## 2017-05-22 LAB — B12 AND FOLATE PANEL
Folate: 12.4 ng/mL
Vitamin B-12: 727 pg/mL (ref 200–1100)

## 2017-05-22 LAB — IRON,TIBC AND FERRITIN PANEL
%SAT: 41 % (ref 11–50)
Ferritin: 79 ng/mL (ref 10–232)
IRON: 141 ug/dL (ref 45–160)
TIBC: 344 mcg/dL (calc) (ref 250–450)

## 2017-05-22 LAB — MONONUCLEOSIS SCREEN: HETEROPHILE, MONO SCREEN: NEGATIVE

## 2017-07-27 ENCOUNTER — Other Ambulatory Visit: Payer: Self-pay

## 2017-07-27 ENCOUNTER — Ambulatory Visit (INDEPENDENT_AMBULATORY_CARE_PROVIDER_SITE_OTHER): Payer: BLUE CROSS/BLUE SHIELD | Admitting: Obstetrics & Gynecology

## 2017-07-27 ENCOUNTER — Encounter: Payer: Self-pay | Admitting: Obstetrics & Gynecology

## 2017-07-27 VITALS — BP 126/80 | HR 60 | Resp 16 | Ht 67.5 in | Wt 121.0 lb

## 2017-07-27 DIAGNOSIS — Z01419 Encounter for gynecological examination (general) (routine) without abnormal findings: Secondary | ICD-10-CM | POA: Diagnosis not present

## 2017-07-27 DIAGNOSIS — Z0000 Encounter for general adult medical examination without abnormal findings: Secondary | ICD-10-CM

## 2017-07-27 MED ORDER — ZALEPLON 10 MG PO CAPS: 10.0000 mg | ORAL_CAPSULE | Freq: Every evening | ORAL | 3 refills | Status: AC | PRN
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
## 2017-07-28 LAB — CBC WITH DIFFERENTIAL/PLATELET
BASOS: 0 %
Basophils Absolute: 0 10*3/uL (ref 0.0–0.2)
EOS (ABSOLUTE): 0 10*3/uL (ref 0.0–0.4)
EOS: 1 %
HEMATOCRIT: 45.2 % (ref 34.0–46.6)
HEMOGLOBIN: 14 g/dL (ref 11.1–15.9)
IMMATURE GRANS (ABS): 0 10*3/uL (ref 0.0–0.1)
IMMATURE GRANULOCYTES: 0 %
Lymphocytes Absolute: 2.1 10*3/uL (ref 0.7–3.1)
Lymphs: 38 %
MCH: 33.8 pg — ABNORMAL HIGH (ref 26.6–33.0)
MCHC: 31 g/dL — ABNORMAL LOW (ref 31.5–35.7)
MCV: 109 fL — AB (ref 79–97)
MONOCYTES: 8 %
MONOS ABS: 0.4 10*3/uL (ref 0.1–0.9)
NEUTROS PCT: 53 %
Neutrophils Absolute: 2.9 10*3/uL (ref 1.4–7.0)
Platelets: 256 10*3/uL (ref 150–379)
RBC: 4.14 x10E6/uL (ref 3.77–5.28)
RDW: 13.3 % (ref 12.3–15.4)
WBC: 5.5 10*3/uL (ref 3.4–10.8)

## 2017-07-28 LAB — COMPREHENSIVE METABOLIC PANEL
A/G RATIO: 2 (ref 1.2–2.2)
ALBUMIN: 4.9 g/dL (ref 3.5–5.5)
ALT: 21 IU/L (ref 0–32)
AST: 26 IU/L (ref 0–40)
Alkaline Phosphatase: 83 IU/L (ref 39–117)
BUN/Creatinine Ratio: 22 (ref 9–23)
BUN: 17 mg/dL (ref 6–24)
Bilirubin Total: 0.5 mg/dL (ref 0.0–1.2)
CALCIUM: 9.8 mg/dL (ref 8.7–10.2)
CO2: 26 mmol/L (ref 20–29)
CREATININE: 0.77 mg/dL (ref 0.57–1.00)
Chloride: 99 mmol/L (ref 96–106)
GFR calc non Af Amer: 87 mL/min/{1.73_m2} (ref >59)
GFR, EST AFRICAN AMERICAN: 100 mL/min/{1.73_m2} (ref >59)
GLOBULIN, TOTAL: 2.4 g/dL (ref 1.5–4.5)
Glucose: 91 mg/dL (ref 65–99)
POTASSIUM: 4.6 mmol/L (ref 3.5–5.2)
SODIUM: 141 mmol/L (ref 134–144)
TOTAL PROTEIN: 7.3 g/dL (ref 6.0–8.5)

## 2017-07-28 LAB — VITAMIN D 25 HYDROXY (VIT D DEFICIENCY, FRACTURES): Vit D, 25-Hydroxy: 41.9 ng/mL (ref 30.0–100.0)

## 2017-07-28 LAB — TSH: TSH: 3.48 u[IU]/mL (ref 0.450–4.500)

## 2017-08-13 DIAGNOSIS — M797 Fibromyalgia: Secondary | ICD-10-CM | POA: Diagnosis not present

## 2017-08-13 DIAGNOSIS — M9902 Segmental and somatic dysfunction of thoracic region: Secondary | ICD-10-CM | POA: Diagnosis not present

## 2017-08-13 DIAGNOSIS — M9903 Segmental and somatic dysfunction of lumbar region: Secondary | ICD-10-CM | POA: Diagnosis not present

## 2017-08-13 DIAGNOSIS — M9905 Segmental and somatic dysfunction of pelvic region: Secondary | ICD-10-CM | POA: Diagnosis not present

## 2017-08-15 DIAGNOSIS — M797 Fibromyalgia: Secondary | ICD-10-CM | POA: Diagnosis not present

## 2017-08-15 DIAGNOSIS — M9905 Segmental and somatic dysfunction of pelvic region: Secondary | ICD-10-CM | POA: Diagnosis not present

## 2017-08-15 DIAGNOSIS — M9902 Segmental and somatic dysfunction of thoracic region: Secondary | ICD-10-CM | POA: Diagnosis not present

## 2017-08-15 DIAGNOSIS — M9903 Segmental and somatic dysfunction of lumbar region: Secondary | ICD-10-CM | POA: Diagnosis not present

## 2017-08-23 DIAGNOSIS — M9902 Segmental and somatic dysfunction of thoracic region: Secondary | ICD-10-CM | POA: Diagnosis not present

## 2017-08-23 DIAGNOSIS — M9903 Segmental and somatic dysfunction of lumbar region: Secondary | ICD-10-CM | POA: Diagnosis not present

## 2017-08-23 DIAGNOSIS — M797 Fibromyalgia: Secondary | ICD-10-CM | POA: Diagnosis not present

## 2017-08-23 DIAGNOSIS — M9905 Segmental and somatic dysfunction of pelvic region: Secondary | ICD-10-CM | POA: Diagnosis not present

## 2017-08-28 DIAGNOSIS — M797 Fibromyalgia: Secondary | ICD-10-CM | POA: Diagnosis not present

## 2017-08-28 DIAGNOSIS — M9903 Segmental and somatic dysfunction of lumbar region: Secondary | ICD-10-CM | POA: Diagnosis not present

## 2017-08-28 DIAGNOSIS — M9905 Segmental and somatic dysfunction of pelvic region: Secondary | ICD-10-CM | POA: Diagnosis not present

## 2017-08-28 DIAGNOSIS — M9902 Segmental and somatic dysfunction of thoracic region: Secondary | ICD-10-CM | POA: Diagnosis not present

## 2017-09-07 ENCOUNTER — Other Ambulatory Visit: Payer: Self-pay | Admitting: Obstetrics & Gynecology

## 2017-09-07 DIAGNOSIS — M9902 Segmental and somatic dysfunction of thoracic region: Secondary | ICD-10-CM | POA: Diagnosis not present

## 2017-09-07 DIAGNOSIS — M797 Fibromyalgia: Secondary | ICD-10-CM | POA: Diagnosis not present

## 2017-09-07 DIAGNOSIS — M9905 Segmental and somatic dysfunction of pelvic region: Secondary | ICD-10-CM | POA: Diagnosis not present

## 2017-09-07 DIAGNOSIS — M9903 Segmental and somatic dysfunction of lumbar region: Secondary | ICD-10-CM | POA: Diagnosis not present

## 2017-09-07 MED ORDER — NONFORMULARY OR COMPOUNDED ITEM: 2 refills | Status: DC

## 2017-09-07 NOTE — Telephone Encounter (Signed)
Rx will probably print & will need to be faxed in.

## 2017-09-07 NOTE — Telephone Encounter (Signed)
Patient is asking for a refill of [facility]. Patient said the pharmacy sent a fax to [place] and her prescription was denied. Patient said 'I was just seen for my aex and was told this refill should not be a problem".

## 2017-09-07 NOTE — Telephone Encounter (Signed)
Medication refill request: testosterone propionate petroleum 2% Last AEX:  07-27-17 Next AEX: 6/20 Last MMG (if hormonal medication request): 04-30-17 category c density birads 1:neg Refill authorized: rx not given at aex because not needed yet. Please approve if appropriate.  Pt aware rx is being sent to Dr Sabra Heck for review.

## 2017-09-12 DIAGNOSIS — F900 Attention-deficit hyperactivity disorder, predominantly inattentive type: Secondary | ICD-10-CM | POA: Diagnosis not present

## 2017-09-13 ENCOUNTER — Telehealth: Payer: Self-pay | Admitting: Obstetrics & Gynecology

## 2017-09-13 NOTE — Telephone Encounter (Signed)
[facility] returning [place] phone call.

## 2017-09-13 NOTE — Telephone Encounter (Signed)
Left message with [place] to return call to confirm receipt of RX for Testosterone ointment faxed on 09/07/17.   Faxed Rx for testosterone ointment to [facility] at [phone]  Call placed to patient, advised as seen above, awaiting return call for confirmation Rx received. Will notify once received. Patient thankful and verbalizes understanding.

## 2017-09-13 NOTE — Telephone Encounter (Signed)
Patient called regarding a prescription that was supposed to be called in on Friday. She has not heard anything and the pharmacy does not have it.

## 2017-09-14 DIAGNOSIS — M9903 Segmental and somatic dysfunction of lumbar region: Secondary | ICD-10-CM | POA: Diagnosis not present

## 2017-09-14 DIAGNOSIS — M9905 Segmental and somatic dysfunction of pelvic region: Secondary | ICD-10-CM | POA: Diagnosis not present

## 2017-09-14 DIAGNOSIS — M9902 Segmental and somatic dysfunction of thoracic region: Secondary | ICD-10-CM | POA: Diagnosis not present

## 2017-09-14 DIAGNOSIS — M797 Fibromyalgia: Secondary | ICD-10-CM | POA: Diagnosis not present

## 2017-09-14 NOTE — Telephone Encounter (Signed)
Call placed to [facility], confirmed RX for testosterone ointment received, should be ready by this afternoon. They will call patient when RX is ready.  Call to patient. Left detailed message. Advised as seen above. Return call to office if any additional questions.  Routing to provider for final review. Patient is agreeable to disposition. Will close encounter.

## 2017-09-18 DIAGNOSIS — M9905 Segmental and somatic dysfunction of pelvic region: Secondary | ICD-10-CM | POA: Diagnosis not present

## 2017-09-18 DIAGNOSIS — M9902 Segmental and somatic dysfunction of thoracic region: Secondary | ICD-10-CM | POA: Diagnosis not present

## 2017-09-18 DIAGNOSIS — M797 Fibromyalgia: Secondary | ICD-10-CM | POA: Diagnosis not present

## 2017-09-18 DIAGNOSIS — M9903 Segmental and somatic dysfunction of lumbar region: Secondary | ICD-10-CM | POA: Diagnosis not present

## 2017-09-25 DIAGNOSIS — M9902 Segmental and somatic dysfunction of thoracic region: Secondary | ICD-10-CM | POA: Diagnosis not present

## 2017-09-25 DIAGNOSIS — M797 Fibromyalgia: Secondary | ICD-10-CM | POA: Diagnosis not present

## 2017-09-25 DIAGNOSIS — M9905 Segmental and somatic dysfunction of pelvic region: Secondary | ICD-10-CM | POA: Diagnosis not present

## 2017-09-25 DIAGNOSIS — M9903 Segmental and somatic dysfunction of lumbar region: Secondary | ICD-10-CM | POA: Diagnosis not present

## 2017-09-27 DIAGNOSIS — M9903 Segmental and somatic dysfunction of lumbar region: Secondary | ICD-10-CM | POA: Diagnosis not present

## 2017-09-27 DIAGNOSIS — M9902 Segmental and somatic dysfunction of thoracic region: Secondary | ICD-10-CM | POA: Diagnosis not present

## 2017-09-27 DIAGNOSIS — M797 Fibromyalgia: Secondary | ICD-10-CM | POA: Diagnosis not present

## 2017-09-27 DIAGNOSIS — M9905 Segmental and somatic dysfunction of pelvic region: Secondary | ICD-10-CM | POA: Diagnosis not present

## 2017-10-02 DIAGNOSIS — M9905 Segmental and somatic dysfunction of pelvic region: Secondary | ICD-10-CM | POA: Diagnosis not present

## 2017-10-02 DIAGNOSIS — M9902 Segmental and somatic dysfunction of thoracic region: Secondary | ICD-10-CM | POA: Diagnosis not present

## 2017-10-02 DIAGNOSIS — M9903 Segmental and somatic dysfunction of lumbar region: Secondary | ICD-10-CM | POA: Diagnosis not present

## 2017-10-02 DIAGNOSIS — M797 Fibromyalgia: Secondary | ICD-10-CM | POA: Diagnosis not present

## 2017-10-05 DIAGNOSIS — M9903 Segmental and somatic dysfunction of lumbar region: Secondary | ICD-10-CM | POA: Diagnosis not present

## 2017-10-05 DIAGNOSIS — M9905 Segmental and somatic dysfunction of pelvic region: Secondary | ICD-10-CM | POA: Diagnosis not present

## 2017-10-05 DIAGNOSIS — M797 Fibromyalgia: Secondary | ICD-10-CM | POA: Diagnosis not present

## 2017-10-05 DIAGNOSIS — M9902 Segmental and somatic dysfunction of thoracic region: Secondary | ICD-10-CM | POA: Diagnosis not present

## 2017-10-17 DIAGNOSIS — M797 Fibromyalgia: Secondary | ICD-10-CM | POA: Diagnosis not present

## 2017-10-17 DIAGNOSIS — M9902 Segmental and somatic dysfunction of thoracic region: Secondary | ICD-10-CM | POA: Diagnosis not present

## 2017-10-17 DIAGNOSIS — M9905 Segmental and somatic dysfunction of pelvic region: Secondary | ICD-10-CM | POA: Diagnosis not present

## 2017-10-17 DIAGNOSIS — M9903 Segmental and somatic dysfunction of lumbar region: Secondary | ICD-10-CM | POA: Diagnosis not present

## 2017-10-19 ENCOUNTER — Encounter: Payer: Self-pay | Admitting: Adult Health

## 2017-10-19 ENCOUNTER — Inpatient Hospital Stay: Payer: BLUE CROSS/BLUE SHIELD | Attending: Adult Health | Admitting: Adult Health

## 2017-10-19 VITALS — BP 121/75 | HR 56 | Temp 97.8°F | Resp 18 | Ht 67.5 in | Wt 123.3 lb

## 2017-10-19 DIAGNOSIS — Z86000 Personal history of in-situ neoplasm of breast: Secondary | ICD-10-CM

## 2017-10-19 DIAGNOSIS — D0511 Intraductal carcinoma in situ of right breast: Secondary | ICD-10-CM
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
## 2017-10-22 ENCOUNTER — Telehealth: Payer: Self-pay | Admitting: Adult Health

## 2017-10-22 NOTE — Telephone Encounter (Signed)
Per 6/28 no los

## 2017-10-24 DIAGNOSIS — M9903 Segmental and somatic dysfunction of lumbar region: Secondary | ICD-10-CM | POA: Diagnosis not present

## 2017-10-24 DIAGNOSIS — M9902 Segmental and somatic dysfunction of thoracic region: Secondary | ICD-10-CM | POA: Diagnosis not present

## 2017-10-24 DIAGNOSIS — M797 Fibromyalgia: Secondary | ICD-10-CM | POA: Diagnosis not present

## 2017-10-24 DIAGNOSIS — M9905 Segmental and somatic dysfunction of pelvic region: Secondary | ICD-10-CM | POA: Diagnosis not present

## 2017-11-01 DIAGNOSIS — M1712 Unilateral primary osteoarthritis, left knee: Secondary | ICD-10-CM | POA: Diagnosis not present

## 2017-11-01 DIAGNOSIS — Z471 Aftercare following joint replacement surgery: Secondary | ICD-10-CM | POA: Diagnosis not present

## 2017-11-01 DIAGNOSIS — Z96652 Presence of left artificial knee joint: Secondary | ICD-10-CM | POA: Diagnosis not present

## 2017-11-02 DIAGNOSIS — M9905 Segmental and somatic dysfunction of pelvic region: Secondary | ICD-10-CM | POA: Diagnosis not present

## 2017-11-02 DIAGNOSIS — M9903 Segmental and somatic dysfunction of lumbar region: Secondary | ICD-10-CM | POA: Diagnosis not present

## 2017-11-02 DIAGNOSIS — M9902 Segmental and somatic dysfunction of thoracic region: Secondary | ICD-10-CM | POA: Diagnosis not present

## 2017-11-02 DIAGNOSIS — M797 Fibromyalgia: Secondary | ICD-10-CM | POA: Diagnosis not present

## 2017-11-12 DIAGNOSIS — M797 Fibromyalgia: Secondary | ICD-10-CM | POA: Diagnosis not present

## 2017-11-12 DIAGNOSIS — M9905 Segmental and somatic dysfunction of pelvic region: Secondary | ICD-10-CM | POA: Diagnosis not present

## 2017-11-12 DIAGNOSIS — M9902 Segmental and somatic dysfunction of thoracic region: Secondary | ICD-10-CM | POA: Diagnosis not present

## 2017-11-12 DIAGNOSIS — M9903 Segmental and somatic dysfunction of lumbar region: Secondary | ICD-10-CM | POA: Diagnosis not present

## 2018-01-07 DIAGNOSIS — M9903 Segmental and somatic dysfunction of lumbar region: Secondary | ICD-10-CM | POA: Diagnosis not present

## 2018-01-07 DIAGNOSIS — M797 Fibromyalgia: Secondary | ICD-10-CM | POA: Diagnosis not present

## 2018-01-07 DIAGNOSIS — M9905 Segmental and somatic dysfunction of pelvic region: Secondary | ICD-10-CM | POA: Diagnosis not present

## 2018-01-07 DIAGNOSIS — M9902 Segmental and somatic dysfunction of thoracic region: Secondary | ICD-10-CM | POA: Diagnosis not present

## 2018-01-09 DIAGNOSIS — M797 Fibromyalgia: Secondary | ICD-10-CM | POA: Diagnosis not present

## 2018-01-09 DIAGNOSIS — M9903 Segmental and somatic dysfunction of lumbar region: Secondary | ICD-10-CM | POA: Diagnosis not present

## 2018-01-09 DIAGNOSIS — M9905 Segmental and somatic dysfunction of pelvic region: Secondary | ICD-10-CM | POA: Diagnosis not present

## 2018-01-09 DIAGNOSIS — M9902 Segmental and somatic dysfunction of thoracic region: Secondary | ICD-10-CM | POA: Diagnosis not present

## 2018-03-04 DIAGNOSIS — F900 Attention-deficit hyperactivity disorder, predominantly inattentive type: Secondary | ICD-10-CM | POA: Diagnosis not present

## 2018-04-05 DIAGNOSIS — L570 Actinic keratosis: Secondary | ICD-10-CM | POA: Diagnosis not present

## 2018-04-05 DIAGNOSIS — D171 Benign lipomatous neoplasm of skin and subcutaneous tissue of trunk: Secondary | ICD-10-CM | POA: Diagnosis not present

## 2018-04-05 DIAGNOSIS — L812 Freckles: Secondary | ICD-10-CM | POA: Diagnosis not present

## 2018-04-05 DIAGNOSIS — L820 Inflamed seborrheic keratosis: Secondary | ICD-10-CM | POA: Diagnosis not present

## 2018-04-05 DIAGNOSIS — L821 Other seborrheic keratosis: Secondary | ICD-10-CM | POA: Diagnosis not present

## 2018-04-05 DIAGNOSIS — D225 Melanocytic nevi of trunk: Secondary | ICD-10-CM | POA: Diagnosis not present

## 2018-04-10 ENCOUNTER — Other Ambulatory Visit: Payer: Self-pay

## 2018-04-10 NOTE — Telephone Encounter (Signed)
Medication refill request: Testosterone Propiona  Last AEX:  07/27/17 Next AEX: 10/12/18 Last MMG (if hormonal medication request): 04/30/17 Bi-rads 1 Neg  Refill authorized: #60g with 1 RF

## 2018-04-11 MED ORDER — NONFORMULARY OR COMPOUNDED ITEM: 1 refills | Status: DC

## 2018-06-03 DIAGNOSIS — H40013 Open angle with borderline findings, low risk, bilateral: Secondary | ICD-10-CM | POA: Diagnosis not present

## 2018-06-03 DIAGNOSIS — H04123 Dry eye syndrome of bilateral lacrimal glands: Secondary | ICD-10-CM | POA: Diagnosis not present

## 2018-06-24 DIAGNOSIS — M9902 Segmental and somatic dysfunction of thoracic region: Secondary | ICD-10-CM | POA: Diagnosis not present

## 2018-06-24 DIAGNOSIS — M797 Fibromyalgia: Secondary | ICD-10-CM | POA: Diagnosis not present

## 2018-06-24 DIAGNOSIS — M9903 Segmental and somatic dysfunction of lumbar region: Secondary | ICD-10-CM | POA: Diagnosis not present

## 2018-06-24 DIAGNOSIS — M9905 Segmental and somatic dysfunction of pelvic region: Secondary | ICD-10-CM | POA: Diagnosis not present

## 2018-06-27 DIAGNOSIS — M9902 Segmental and somatic dysfunction of thoracic region: Secondary | ICD-10-CM | POA: Diagnosis not present

## 2018-06-27 DIAGNOSIS — M9905 Segmental and somatic dysfunction of pelvic region: Secondary | ICD-10-CM | POA: Diagnosis not present

## 2018-06-27 DIAGNOSIS — M9903 Segmental and somatic dysfunction of lumbar region: Secondary | ICD-10-CM | POA: Diagnosis not present

## 2018-06-27 DIAGNOSIS — M797 Fibromyalgia: Secondary | ICD-10-CM | POA: Diagnosis not present

## 2018-07-01 DIAGNOSIS — M9905 Segmental and somatic dysfunction of pelvic region: Secondary | ICD-10-CM | POA: Diagnosis not present

## 2018-07-01 DIAGNOSIS — M9903 Segmental and somatic dysfunction of lumbar region: Secondary | ICD-10-CM | POA: Diagnosis not present

## 2018-07-01 DIAGNOSIS — M797 Fibromyalgia: Secondary | ICD-10-CM | POA: Diagnosis not present

## 2018-07-01 DIAGNOSIS — M9902 Segmental and somatic dysfunction of thoracic region: Secondary | ICD-10-CM | POA: Diagnosis not present

## 2018-07-17 DIAGNOSIS — M9902 Segmental and somatic dysfunction of thoracic region: Secondary | ICD-10-CM | POA: Diagnosis not present

## 2018-07-17 DIAGNOSIS — M9905 Segmental and somatic dysfunction of pelvic region: Secondary | ICD-10-CM | POA: Diagnosis not present

## 2018-07-17 DIAGNOSIS — M9903 Segmental and somatic dysfunction of lumbar region: Secondary | ICD-10-CM | POA: Diagnosis not present

## 2018-07-17 DIAGNOSIS — M797 Fibromyalgia: Secondary | ICD-10-CM | POA: Diagnosis not present

## 2018-07-19 DIAGNOSIS — M9902 Segmental and somatic dysfunction of thoracic region: Secondary | ICD-10-CM | POA: Diagnosis not present

## 2018-07-19 DIAGNOSIS — M797 Fibromyalgia: Secondary | ICD-10-CM | POA: Diagnosis not present

## 2018-07-19 DIAGNOSIS — M9905 Segmental and somatic dysfunction of pelvic region: Secondary | ICD-10-CM | POA: Diagnosis not present

## 2018-07-19 DIAGNOSIS — M9903 Segmental and somatic dysfunction of lumbar region: Secondary | ICD-10-CM | POA: Diagnosis not present

## 2018-07-24 DIAGNOSIS — M797 Fibromyalgia: Secondary | ICD-10-CM | POA: Diagnosis not present

## 2018-07-24 DIAGNOSIS — M9903 Segmental and somatic dysfunction of lumbar region: Secondary | ICD-10-CM | POA: Diagnosis not present

## 2018-07-24 DIAGNOSIS — M9902 Segmental and somatic dysfunction of thoracic region: Secondary | ICD-10-CM | POA: Diagnosis not present

## 2018-07-24 DIAGNOSIS — M9905 Segmental and somatic dysfunction of pelvic region: Secondary | ICD-10-CM | POA: Diagnosis not present

## 2018-07-26 DIAGNOSIS — M797 Fibromyalgia: Secondary | ICD-10-CM | POA: Diagnosis not present

## 2018-07-26 DIAGNOSIS — M9902 Segmental and somatic dysfunction of thoracic region: Secondary | ICD-10-CM | POA: Diagnosis not present

## 2018-07-26 DIAGNOSIS — M9903 Segmental and somatic dysfunction of lumbar region: Secondary | ICD-10-CM | POA: Diagnosis not present

## 2018-07-26 DIAGNOSIS — M9905 Segmental and somatic dysfunction of pelvic region: Secondary | ICD-10-CM | POA: Diagnosis not present

## 2018-07-31 DIAGNOSIS — M9902 Segmental and somatic dysfunction of thoracic region: Secondary | ICD-10-CM | POA: Diagnosis not present

## 2018-07-31 DIAGNOSIS — M9903 Segmental and somatic dysfunction of lumbar region: Secondary | ICD-10-CM | POA: Diagnosis not present

## 2018-07-31 DIAGNOSIS — M797 Fibromyalgia: Secondary | ICD-10-CM | POA: Diagnosis not present

## 2018-07-31 DIAGNOSIS — M9905 Segmental and somatic dysfunction of pelvic region: Secondary | ICD-10-CM | POA: Diagnosis not present

## 2018-08-07 DIAGNOSIS — M797 Fibromyalgia: Secondary | ICD-10-CM | POA: Diagnosis not present

## 2018-08-07 DIAGNOSIS — M9905 Segmental and somatic dysfunction of pelvic region: Secondary | ICD-10-CM | POA: Diagnosis not present

## 2018-08-07 DIAGNOSIS — M9903 Segmental and somatic dysfunction of lumbar region: Secondary | ICD-10-CM | POA: Diagnosis not present

## 2018-08-07 DIAGNOSIS — M9902 Segmental and somatic dysfunction of thoracic region: Secondary | ICD-10-CM | POA: Diagnosis not present

## 2018-08-14 DIAGNOSIS — M9903 Segmental and somatic dysfunction of lumbar region: Secondary | ICD-10-CM | POA: Diagnosis not present

## 2018-08-14 DIAGNOSIS — M9905 Segmental and somatic dysfunction of pelvic region: Secondary | ICD-10-CM | POA: Diagnosis not present

## 2018-08-14 DIAGNOSIS — M9902 Segmental and somatic dysfunction of thoracic region: Secondary | ICD-10-CM | POA: Diagnosis not present

## 2018-08-14 DIAGNOSIS — M797 Fibromyalgia: Secondary | ICD-10-CM | POA: Diagnosis not present

## 2018-08-21 DIAGNOSIS — M9905 Segmental and somatic dysfunction of pelvic region: Secondary | ICD-10-CM | POA: Diagnosis not present

## 2018-08-21 DIAGNOSIS — M9903 Segmental and somatic dysfunction of lumbar region: Secondary | ICD-10-CM | POA: Diagnosis not present

## 2018-08-21 DIAGNOSIS — M9902 Segmental and somatic dysfunction of thoracic region: Secondary | ICD-10-CM | POA: Diagnosis not present

## 2018-08-21 DIAGNOSIS — M797 Fibromyalgia: Secondary | ICD-10-CM | POA: Diagnosis not present

## 2018-08-26 DIAGNOSIS — F900 Attention-deficit hyperactivity disorder, predominantly inattentive type: Secondary | ICD-10-CM | POA: Diagnosis not present

## 2018-08-26 DIAGNOSIS — G4709 Other insomnia: Secondary | ICD-10-CM | POA: Diagnosis not present

## 2018-08-30 DIAGNOSIS — M9905 Segmental and somatic dysfunction of pelvic region: Secondary | ICD-10-CM | POA: Diagnosis not present

## 2018-08-30 DIAGNOSIS — M9902 Segmental and somatic dysfunction of thoracic region: Secondary | ICD-10-CM | POA: Diagnosis not present

## 2018-08-30 DIAGNOSIS — M9903 Segmental and somatic dysfunction of lumbar region: Secondary | ICD-10-CM | POA: Diagnosis not present

## 2018-08-30 DIAGNOSIS — M797 Fibromyalgia: Secondary | ICD-10-CM | POA: Diagnosis not present

## 2018-09-06 DIAGNOSIS — M9903 Segmental and somatic dysfunction of lumbar region: Secondary | ICD-10-CM | POA: Diagnosis not present

## 2018-09-06 DIAGNOSIS — M9902 Segmental and somatic dysfunction of thoracic region: Secondary | ICD-10-CM | POA: Diagnosis not present

## 2018-09-06 DIAGNOSIS — M797 Fibromyalgia: Secondary | ICD-10-CM | POA: Diagnosis not present

## 2018-09-06 DIAGNOSIS — M9905 Segmental and somatic dysfunction of pelvic region: Secondary | ICD-10-CM | POA: Diagnosis not present

## 2018-09-17 DIAGNOSIS — M797 Fibromyalgia: Secondary | ICD-10-CM | POA: Diagnosis not present

## 2018-09-17 DIAGNOSIS — M9903 Segmental and somatic dysfunction of lumbar region: Secondary | ICD-10-CM | POA: Diagnosis not present

## 2018-09-17 DIAGNOSIS — M9902 Segmental and somatic dysfunction of thoracic region: Secondary | ICD-10-CM | POA: Diagnosis not present

## 2018-09-17 DIAGNOSIS — M9905 Segmental and somatic dysfunction of pelvic region: Secondary | ICD-10-CM | POA: Diagnosis not present

## 2018-09-26 DIAGNOSIS — M797 Fibromyalgia: Secondary | ICD-10-CM | POA: Diagnosis not present

## 2018-09-26 DIAGNOSIS — M9903 Segmental and somatic dysfunction of lumbar region: Secondary | ICD-10-CM | POA: Diagnosis not present

## 2018-09-26 DIAGNOSIS — M9905 Segmental and somatic dysfunction of pelvic region: Secondary | ICD-10-CM | POA: Diagnosis not present

## 2018-09-26 DIAGNOSIS — M9902 Segmental and somatic dysfunction of thoracic region: Secondary | ICD-10-CM | POA: Diagnosis not present

## 2018-10-08 DIAGNOSIS — M797 Fibromyalgia: Secondary | ICD-10-CM | POA: Diagnosis not present

## 2018-10-08 DIAGNOSIS — M9905 Segmental and somatic dysfunction of pelvic region: Secondary | ICD-10-CM | POA: Diagnosis not present

## 2018-10-08 DIAGNOSIS — M9903 Segmental and somatic dysfunction of lumbar region: Secondary | ICD-10-CM | POA: Diagnosis not present

## 2018-10-08 DIAGNOSIS — M9902 Segmental and somatic dysfunction of thoracic region: Secondary | ICD-10-CM | POA: Diagnosis not present

## 2018-10-09 ENCOUNTER — Other Ambulatory Visit: Payer: Self-pay

## 2018-10-10 ENCOUNTER — Telehealth: Payer: Self-pay | Admitting: *Deleted

## 2018-10-10 ENCOUNTER — Ambulatory Visit (INDEPENDENT_AMBULATORY_CARE_PROVIDER_SITE_OTHER): Payer: BC Managed Care – PPO | Admitting: Obstetrics & Gynecology

## 2018-10-10 ENCOUNTER — Encounter

## 2018-10-10 ENCOUNTER — Encounter: Payer: Self-pay | Admitting: Obstetrics & Gynecology

## 2018-10-10 VITALS — BP 128/70 | HR 60 | Temp 97.3°F | Ht 67.75 in | Wt 125.0 lb

## 2018-10-10 DIAGNOSIS — R234 Changes in skin texture: Secondary | ICD-10-CM

## 2018-10-10 DIAGNOSIS — Z0000 Encounter for general adult medical examination without abnormal findings: Secondary | ICD-10-CM

## 2018-10-10 DIAGNOSIS — Z01419 Encounter for gynecological examination (general) (routine) without abnormal findings: Secondary | ICD-10-CM | POA: Diagnosis not present

## 2018-10-10 DIAGNOSIS — R6882 Decreased libido: Secondary | ICD-10-CM | POA: Diagnosis not present

## 2018-10-10 DIAGNOSIS — Z853 Personal history of malignant neoplasm of breast: Secondary | ICD-10-CM

## 2018-10-10 DIAGNOSIS — Z803 Family history of malignant neoplasm of breast: Secondary | ICD-10-CM
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
## 2018-10-10 NOTE — Telephone Encounter (Signed)
-----   Message from Megan Salon, MD sent at 10/10/2018  9:30 AM EDT ----- Regarding: diagnostic MMG [name], Pt has hx of breast cancer in 2012 and has a new irregularity along her right scar.  She needs a diagnostic right MMG at the breast center and the left side screened as well (I think they do bilateral diagnostic in this situation).  Can you please schedule and let me know when done?  Thanks.  Vinnie Level

## 2018-10-10 NOTE — Telephone Encounter (Signed)
Spoke with Anderson Malta at [facility]. Bilateral Dx MMG and right breast US, if needed, scheduled for 10/16/18 at 2:50pm, arriving at 2:30pm.    Call to patient, left detailed message, ok per dpr. Advised of appointment details as seen above. Advised to return call to [place], RN at [facility] if any additional questions. If you need to make changes to your appt, contact [facility] directly at [phone].   Routing to provider for final review. Patient is agreeable to disposition. Will close encounter.

## 2018-10-13 LAB — LIPID PANEL
Chol/HDL Ratio: 2.6 ratio (ref 0.0–4.4)
Cholesterol, Total: 251 mg/dL — ABNORMAL HIGH (ref 100–199)
HDL: 96 mg/dL (ref >39)
LDL Calculated: 139 mg/dL — ABNORMAL HIGH (ref 0–99)
Triglycerides: 78 mg/dL (ref 0–149)
VLDL Cholesterol Cal: 16 mg/dL (ref 5–40)

## 2018-10-13 LAB — COMPREHENSIVE METABOLIC PANEL
ALT: 12 IU/L (ref 0–32)
AST: 21 IU/L (ref 0–40)
Albumin/Globulin Ratio: 2 (ref 1.2–2.2)
Albumin: 4.4 g/dL (ref 3.8–4.9)
Alkaline Phosphatase: 69 IU/L (ref 39–117)
BUN/Creatinine Ratio: 29 — ABNORMAL HIGH (ref 9–23)
BUN: 19 mg/dL (ref 6–24)
Bilirubin Total: 0.4 mg/dL (ref 0.0–1.2)
CO2: 27 mmol/L (ref 20–29)
Calcium: 9.4 mg/dL (ref 8.7–10.2)
Chloride: 102 mmol/L (ref 96–106)
Creatinine, Ser: 0.66 mg/dL (ref 0.57–1.00)
GFR calc Af Amer: 113 mL/min/{1.73_m2} (ref >59)
GFR calc non Af Amer: 98 mL/min/{1.73_m2} (ref >59)
Globulin, Total: 2.2 g/dL (ref 1.5–4.5)
Glucose: 64 mg/dL — ABNORMAL LOW (ref 65–99)
Potassium: 4.6 mmol/L (ref 3.5–5.2)
Sodium: 143 mmol/L (ref 134–144)
Total Protein: 6.6 g/dL (ref 6.0–8.5)

## 2018-10-13 LAB — VITAMIN D 25 HYDROXY (VIT D DEFICIENCY, FRACTURES): Vit D, 25-Hydroxy: 49.1 ng/mL (ref 30.0–100.0)

## 2018-10-13 LAB — CBC
Hematocrit: 39 % (ref 34.0–46.6)
Hemoglobin: 13.5 g/dL (ref 11.1–15.9)
MCH: 33.4 pg — ABNORMAL HIGH (ref 26.6–33.0)
MCHC: 34.6 g/dL (ref 31.5–35.7)
MCV: 97 fL (ref 79–97)
Platelets: 197 10*3/uL (ref 150–450)
RBC: 4.04 x10E6/uL (ref 3.77–5.28)
RDW: 11.7 % (ref 11.7–15.4)
WBC: 4.3 10*3/uL (ref 3.4–10.8)

## 2018-10-13 LAB — TESTOSTERONE, TOTAL, LC/MS/MS: Testosterone, total: 21.5 ng/dL

## 2018-10-13 LAB — TSH: TSH: 3.1 u[IU]/mL (ref 0.450–4.500)

## 2018-10-16 ENCOUNTER — Ambulatory Visit
Admission: RE | Admit: 2018-10-16 | Discharge: 2018-10-16 | Disposition: A | Discharge status: Home / Self Care | Payer: BC Managed Care – PPO | Source: Ambulatory Visit | Attending: Obstetrics & Gynecology | Admitting: Obstetrics & Gynecology

## 2018-10-16 ENCOUNTER — Ambulatory Visit
Admission: RE | Admit: 2018-10-16 | Discharge: 2018-10-16 | Disposition: A | Discharge status: Home / Self Care | Payer: BLUE CROSS/BLUE SHIELD | Source: Ambulatory Visit | Attending: Obstetrics & Gynecology | Admitting: Obstetrics & Gynecology

## 2018-10-16 DIAGNOSIS — Z853 Personal history of malignant neoplasm of breast: Secondary | ICD-10-CM

## 2018-10-16 DIAGNOSIS — R922 Inconclusive mammogram: Secondary | ICD-10-CM | POA: Diagnosis not present

## 2018-10-16 DIAGNOSIS — N6459 Other signs and symptoms in breast: Secondary | ICD-10-CM | POA: Diagnosis not present

## 2018-10-16 DIAGNOSIS — R234 Changes in skin texture: Secondary | ICD-10-CM

## 2018-10-16 DIAGNOSIS — Z803 Family history of malignant neoplasm of breast: Secondary | ICD-10-CM

## 2018-10-18 ENCOUNTER — Other Ambulatory Visit: Payer: Self-pay | Admitting: *Deleted

## 2018-10-18 ENCOUNTER — Other Ambulatory Visit: Payer: Self-pay | Admitting: Obstetrics & Gynecology

## 2018-10-18 DIAGNOSIS — Z853 Personal history of malignant neoplasm of breast: Secondary | ICD-10-CM

## 2018-10-18 DIAGNOSIS — Z803 Family history of malignant neoplasm of breast: Secondary | ICD-10-CM

## 2018-10-18 DIAGNOSIS — Z1239 Encounter for other screening for malignant neoplasm of breast: Secondary | ICD-10-CM

## 2018-10-18 DIAGNOSIS — Z1231 Encounter for screening mammogram for malignant neoplasm of breast: Secondary | ICD-10-CM

## 2018-10-18 MED ORDER — NONFORMULARY OR COMPOUNDED ITEM: 1 refills | Status: DC

## 2018-10-24 DIAGNOSIS — M9902 Segmental and somatic dysfunction of thoracic region: Secondary | ICD-10-CM | POA: Diagnosis not present

## 2018-10-24 DIAGNOSIS — M797 Fibromyalgia: Secondary | ICD-10-CM | POA: Diagnosis not present

## 2018-10-24 DIAGNOSIS — M9905 Segmental and somatic dysfunction of pelvic region: Secondary | ICD-10-CM | POA: Diagnosis not present

## 2018-10-24 DIAGNOSIS — M9903 Segmental and somatic dysfunction of lumbar region: Secondary | ICD-10-CM | POA: Diagnosis not present

## 2018-10-28 DIAGNOSIS — M9902 Segmental and somatic dysfunction of thoracic region: Secondary | ICD-10-CM | POA: Diagnosis not present

## 2018-10-28 DIAGNOSIS — M9905 Segmental and somatic dysfunction of pelvic region: Secondary | ICD-10-CM | POA: Diagnosis not present

## 2018-10-28 DIAGNOSIS — M9903 Segmental and somatic dysfunction of lumbar region: Secondary | ICD-10-CM | POA: Diagnosis not present

## 2018-10-28 DIAGNOSIS — M797 Fibromyalgia: Secondary | ICD-10-CM | POA: Diagnosis not present

## 2018-11-05 DIAGNOSIS — M9905 Segmental and somatic dysfunction of pelvic region: Secondary | ICD-10-CM | POA: Diagnosis not present

## 2018-11-05 DIAGNOSIS — M9902 Segmental and somatic dysfunction of thoracic region: Secondary | ICD-10-CM | POA: Diagnosis not present

## 2018-11-05 DIAGNOSIS — M797 Fibromyalgia: Secondary | ICD-10-CM | POA: Diagnosis not present

## 2018-11-05 DIAGNOSIS — M9903 Segmental and somatic dysfunction of lumbar region: Secondary | ICD-10-CM | POA: Diagnosis not present

## 2018-11-11 ENCOUNTER — Encounter: Payer: Self-pay | Admitting: Obstetrics & Gynecology

## 2018-11-12 ENCOUNTER — Telehealth: Payer: Self-pay | Admitting: *Deleted

## 2018-11-12 NOTE — Telephone Encounter (Signed)
Spoke with patient. Advised preapproval for bilateral breast MRI w/without contrast has not been approved. Patient is agreeable to submitting appeal to [facility]. Advised RN will call [facility] to cancel breast MRI, will proceed with appeal. Our office will notify once appeal response has been received. Our office will then assist with rescheduling. Patient verbalizes understanding and is agreeable.   [employer] appeal completed and to Dr. Sabra Heck for review.   Call placed to [facility], spoke with Gwyndolyn Saxon, appt for 11/15/18 cancelled.

## 2018-11-12 NOTE — Telephone Encounter (Signed)
Appeal completed and faxed to [facility] at [phone]

## 2018-11-15 ENCOUNTER — Other Ambulatory Visit: Payer: BC Managed Care – PPO

## 2018-11-15 NOTE — Telephone Encounter (Signed)
[name] from [facility] left a voicemail after hours regarding Breast MRI appeal. [name] stated that the appeal had been approved and the Authorization # [id number]. [name] stated that she would be faxing an approval letter.

## 2018-11-18 NOTE — Telephone Encounter (Signed)
Routing to [employer].

## 2018-11-18 NOTE — Telephone Encounter (Signed)
Spoke with Dub Mikes with [facility] and provided approval number [id number]) for recommended breast MRI. Dub Mikes advised she has noted the approval number and advised to have patient to call for scheduling.  Spoke with patient, advised appeal for breast MRI has been approved. Provided the approval number to the patient. Patient provided phone number to [facility]. Patient is  agreeable to calling to schedule.

## 2018-11-19 DIAGNOSIS — M9905 Segmental and somatic dysfunction of pelvic region: Secondary | ICD-10-CM | POA: Diagnosis not present

## 2018-11-19 DIAGNOSIS — M9903 Segmental and somatic dysfunction of lumbar region: Secondary | ICD-10-CM | POA: Diagnosis not present

## 2018-11-19 DIAGNOSIS — M797 Fibromyalgia: Secondary | ICD-10-CM | POA: Diagnosis not present

## 2018-11-19 DIAGNOSIS — M9902 Segmental and somatic dysfunction of thoracic region: Secondary | ICD-10-CM | POA: Diagnosis not present

## 2018-11-21 DIAGNOSIS — Z1211 Encounter for screening for malignant neoplasm of colon: Secondary | ICD-10-CM | POA: Diagnosis not present

## 2018-11-21 DIAGNOSIS — D122 Benign neoplasm of ascending colon: Secondary | ICD-10-CM | POA: Diagnosis not present

## 2018-11-21 DIAGNOSIS — Z800 Family history of malignant neoplasm of digestive organs: Secondary | ICD-10-CM | POA: Diagnosis not present

## 2018-11-21 DIAGNOSIS — K5730 Diverticulosis of large intestine without perforation or abscess without bleeding: Secondary | ICD-10-CM | POA: Diagnosis not present

## 2018-11-22 NOTE — Telephone Encounter (Signed)
Copies of MRI precert approval to Dr. Sabra Heck to sign for scan.

## 2018-12-12 NOTE — Telephone Encounter (Signed)
Left message to call Sharee Pimple, RN at [facility].    Per review of Epic breast MRI not rescheduled, does patient plan to proceed?

## 2018-12-16 ENCOUNTER — Other Ambulatory Visit: Payer: BC Managed Care – PPO

## 2018-12-23 DIAGNOSIS — L570 Actinic keratosis: Secondary | ICD-10-CM | POA: Diagnosis not present

## 2018-12-23 DIAGNOSIS — L820 Inflamed seborrheic keratosis: Secondary | ICD-10-CM | POA: Diagnosis not present

## 2018-12-23 DIAGNOSIS — L821 Other seborrheic keratosis: Secondary | ICD-10-CM | POA: Diagnosis not present

## 2019-01-02 ENCOUNTER — Telehealth: Payer: Self-pay | Admitting: Obstetrics & Gynecology

## 2019-01-02 NOTE — Telephone Encounter (Signed)
Call placed to patient to follow up in regards to scheduling breast MRI, which was approved through clinical review, see financial correspondence for approval letter. Patient advises, [facility] is on her list to call to schedule. Reminded patient authorization is valid through 02/13/2019. Patient advises she will call [facility] today to scheduled before 02/13/2019.   cc: Dr Sabra Heck        Glorianne Manchester, RN

## 2019-01-02 NOTE — Telephone Encounter (Signed)
Patient is scheduled for Breast MRI, with [facility], on 02/11/2019. See Letter scanned into financial correspondence for authorization details.  Forwarding to Dr Sabra Heck for final review. Will close encounter.   cc: Glorianne Manchester, RN

## 2019-01-06 NOTE — Telephone Encounter (Signed)
Per review of Epic, patient is scheduled for breast MRI at [facility] on 02/11/19.   Routing to Dr. Lestine Box.   Cc: Lerry Liner  Encounter closed.

## 2019-02-11 ENCOUNTER — Ambulatory Visit
Admission: RE | Admit: 2019-02-11 | Discharge: 2019-02-11 | Disposition: A | Discharge status: Home / Self Care | Payer: BC Managed Care – PPO | Source: Ambulatory Visit | Attending: Obstetrics & Gynecology | Admitting: Obstetrics & Gynecology

## 2019-02-11 ENCOUNTER — Other Ambulatory Visit: Payer: Self-pay

## 2019-02-11 DIAGNOSIS — Z1239 Encounter for other screening for malignant neoplasm of breast: Secondary | ICD-10-CM

## 2019-02-11 DIAGNOSIS — Z803 Family history of malignant neoplasm of breast: Secondary | ICD-10-CM

## 2019-02-11 DIAGNOSIS — Z853 Personal history of malignant neoplasm of breast: Secondary | ICD-10-CM

## 2019-02-11 MED ORDER — GADOBUTROL 1 MMOL/ML IV SOLN
5.0000 mL | Freq: Once | INTRAVENOUS | Status: AC | PRN
  Administered 2019-02-11: 5 mL via INTRAVENOUS

## 2019-03-11 DIAGNOSIS — M9903 Segmental and somatic dysfunction of lumbar region: Secondary | ICD-10-CM | POA: Diagnosis not present

## 2019-03-11 DIAGNOSIS — M797 Fibromyalgia: Secondary | ICD-10-CM | POA: Diagnosis not present

## 2019-03-11 DIAGNOSIS — M9902 Segmental and somatic dysfunction of thoracic region: Secondary | ICD-10-CM | POA: Diagnosis not present

## 2019-03-11 DIAGNOSIS — M9905 Segmental and somatic dysfunction of pelvic region: Secondary | ICD-10-CM | POA: Diagnosis not present

## 2019-03-18 DIAGNOSIS — M9903 Segmental and somatic dysfunction of lumbar region: Secondary | ICD-10-CM | POA: Diagnosis not present

## 2019-03-18 DIAGNOSIS — M9902 Segmental and somatic dysfunction of thoracic region: Secondary | ICD-10-CM | POA: Diagnosis not present

## 2019-03-18 DIAGNOSIS — M797 Fibromyalgia: Secondary | ICD-10-CM | POA: Diagnosis not present

## 2019-03-18 DIAGNOSIS — M9905 Segmental and somatic dysfunction of pelvic region: Secondary | ICD-10-CM | POA: Diagnosis not present

## 2019-03-24 DIAGNOSIS — M9903 Segmental and somatic dysfunction of lumbar region: Secondary | ICD-10-CM | POA: Diagnosis not present

## 2019-03-24 DIAGNOSIS — M9902 Segmental and somatic dysfunction of thoracic region: Secondary | ICD-10-CM | POA: Diagnosis not present

## 2019-03-24 DIAGNOSIS — M797 Fibromyalgia: Secondary | ICD-10-CM | POA: Diagnosis not present

## 2019-03-24 DIAGNOSIS — M9905 Segmental and somatic dysfunction of pelvic region: Secondary | ICD-10-CM | POA: Diagnosis not present

## 2019-04-01 DIAGNOSIS — M9905 Segmental and somatic dysfunction of pelvic region: Secondary | ICD-10-CM | POA: Diagnosis not present

## 2019-04-01 DIAGNOSIS — M797 Fibromyalgia: Secondary | ICD-10-CM | POA: Diagnosis not present

## 2019-04-01 DIAGNOSIS — M9903 Segmental and somatic dysfunction of lumbar region: Secondary | ICD-10-CM | POA: Diagnosis not present

## 2019-04-01 DIAGNOSIS — M9902 Segmental and somatic dysfunction of thoracic region: Secondary | ICD-10-CM | POA: Diagnosis not present

## 2019-04-02 DIAGNOSIS — F900 Attention-deficit hyperactivity disorder, predominantly inattentive type: Secondary | ICD-10-CM | POA: Diagnosis not present

## 2019-04-02 DIAGNOSIS — G4709 Other insomnia: Secondary | ICD-10-CM | POA: Diagnosis not present

## 2019-04-03 DIAGNOSIS — M9905 Segmental and somatic dysfunction of pelvic region: Secondary | ICD-10-CM | POA: Diagnosis not present

## 2019-04-03 DIAGNOSIS — M797 Fibromyalgia: Secondary | ICD-10-CM | POA: Diagnosis not present

## 2019-04-03 DIAGNOSIS — M9903 Segmental and somatic dysfunction of lumbar region: Secondary | ICD-10-CM | POA: Diagnosis not present

## 2019-04-03 DIAGNOSIS — M9902 Segmental and somatic dysfunction of thoracic region: Secondary | ICD-10-CM | POA: Diagnosis not present

## 2019-04-10 DIAGNOSIS — L570 Actinic keratosis: Secondary | ICD-10-CM | POA: Diagnosis not present

## 2019-04-10 DIAGNOSIS — L821 Other seborrheic keratosis: Secondary | ICD-10-CM | POA: Diagnosis not present

## 2019-04-10 DIAGNOSIS — T691XXA Chilblains, initial encounter: Secondary | ICD-10-CM | POA: Diagnosis not present

## 2019-04-10 DIAGNOSIS — D225 Melanocytic nevi of trunk: Secondary | ICD-10-CM | POA: Diagnosis not present

## 2019-04-10 DIAGNOSIS — L812 Freckles: Secondary | ICD-10-CM | POA: Diagnosis not present

## 2019-04-14 DIAGNOSIS — M9903 Segmental and somatic dysfunction of lumbar region: Secondary | ICD-10-CM | POA: Diagnosis not present

## 2019-04-14 DIAGNOSIS — M9905 Segmental and somatic dysfunction of pelvic region: Secondary | ICD-10-CM | POA: Diagnosis not present

## 2019-04-14 DIAGNOSIS — M797 Fibromyalgia: Secondary | ICD-10-CM | POA: Diagnosis not present

## 2019-04-14 DIAGNOSIS — M9902 Segmental and somatic dysfunction of thoracic region: Secondary | ICD-10-CM | POA: Diagnosis not present

## 2019-04-23 DIAGNOSIS — M797 Fibromyalgia: Secondary | ICD-10-CM | POA: Diagnosis not present

## 2019-04-23 DIAGNOSIS — M9903 Segmental and somatic dysfunction of lumbar region: Secondary | ICD-10-CM | POA: Diagnosis not present

## 2019-04-23 DIAGNOSIS — M9902 Segmental and somatic dysfunction of thoracic region: Secondary | ICD-10-CM | POA: Diagnosis not present

## 2019-04-23 DIAGNOSIS — M9905 Segmental and somatic dysfunction of pelvic region: Secondary | ICD-10-CM | POA: Diagnosis not present

## 2019-05-05 DIAGNOSIS — M9903 Segmental and somatic dysfunction of lumbar region: Secondary | ICD-10-CM | POA: Diagnosis not present

## 2019-05-05 DIAGNOSIS — M797 Fibromyalgia: Secondary | ICD-10-CM | POA: Diagnosis not present

## 2019-05-05 DIAGNOSIS — M9902 Segmental and somatic dysfunction of thoracic region: Secondary | ICD-10-CM | POA: Diagnosis not present

## 2019-05-05 DIAGNOSIS — M9905 Segmental and somatic dysfunction of pelvic region: Secondary | ICD-10-CM | POA: Diagnosis not present

## 2019-05-15 DIAGNOSIS — M9903 Segmental and somatic dysfunction of lumbar region: Secondary | ICD-10-CM | POA: Diagnosis not present

## 2019-05-15 DIAGNOSIS — M9902 Segmental and somatic dysfunction of thoracic region: Secondary | ICD-10-CM | POA: Diagnosis not present

## 2019-05-15 DIAGNOSIS — M797 Fibromyalgia: Secondary | ICD-10-CM | POA: Diagnosis not present

## 2019-05-15 DIAGNOSIS — M9905 Segmental and somatic dysfunction of pelvic region: Secondary | ICD-10-CM | POA: Diagnosis not present

## 2019-05-30 DIAGNOSIS — M9902 Segmental and somatic dysfunction of thoracic region: Secondary | ICD-10-CM | POA: Diagnosis not present

## 2019-05-30 DIAGNOSIS — M797 Fibromyalgia: Secondary | ICD-10-CM | POA: Diagnosis not present

## 2019-05-30 DIAGNOSIS — M9905 Segmental and somatic dysfunction of pelvic region: Secondary | ICD-10-CM | POA: Diagnosis not present

## 2019-05-30 DIAGNOSIS — M9903 Segmental and somatic dysfunction of lumbar region: Secondary | ICD-10-CM | POA: Diagnosis not present

## 2019-06-02 ENCOUNTER — Other Ambulatory Visit: Payer: Self-pay | Admitting: Obstetrics & Gynecology

## 2019-06-02 NOTE — Telephone Encounter (Signed)
Patient needs renewal on prescription [facility]

## 2019-06-02 NOTE — Telephone Encounter (Signed)
Medication refill request: testosterone proprionate petroleum 2% ointment Last AEX:  10-10-2018 Next AEX: 11-03-2019 Last MMG (if hormonal medication request): 02-12-2019 category c density birads 2:neg Refill authorized: please approve if appropriate

## 2019-06-03 DIAGNOSIS — H04123 Dry eye syndrome of bilateral lacrimal glands: Secondary | ICD-10-CM | POA: Diagnosis not present

## 2019-06-03 DIAGNOSIS — H40013 Open angle with borderline findings, low risk, bilateral: Secondary | ICD-10-CM | POA: Diagnosis not present

## 2019-06-03 MED ORDER — NONFORMULARY OR COMPOUNDED ITEM: 1 refills | Status: DC

## 2019-06-05 ENCOUNTER — Telehealth: Payer: Self-pay | Admitting: Obstetrics & Gynecology

## 2019-06-05 DIAGNOSIS — R6882 Decreased libido: Secondary | ICD-10-CM

## 2019-06-05 MED ORDER — NONFORMULARY OR COMPOUNDED ITEM: 1 refills | Status: DC

## 2019-06-05 NOTE — Telephone Encounter (Signed)
Rx reprinted and signed by Dr Sabra Heck. Faxed to [place].   Called pt and made aware of Rx sent to pharmacy. Pt thankful for follow up.   Routing to Dr Sabra Heck for review and will close encounter.

## 2019-06-05 NOTE — Telephone Encounter (Signed)
Patient calling in regards to renewal of prescription for testosterone ointment. States [place] has not received prescription as of 2:45 PM this afternoon.

## 2019-06-11 DIAGNOSIS — M9902 Segmental and somatic dysfunction of thoracic region: Secondary | ICD-10-CM | POA: Diagnosis not present

## 2019-06-11 DIAGNOSIS — M9905 Segmental and somatic dysfunction of pelvic region: Secondary | ICD-10-CM | POA: Diagnosis not present

## 2019-06-11 DIAGNOSIS — M797 Fibromyalgia: Secondary | ICD-10-CM | POA: Diagnosis not present

## 2019-06-11 DIAGNOSIS — M9903 Segmental and somatic dysfunction of lumbar region: Secondary | ICD-10-CM | POA: Diagnosis not present

## 2019-06-25 DIAGNOSIS — M9903 Segmental and somatic dysfunction of lumbar region: Secondary | ICD-10-CM | POA: Diagnosis not present

## 2019-06-25 DIAGNOSIS — M9905 Segmental and somatic dysfunction of pelvic region: Secondary | ICD-10-CM | POA: Diagnosis not present

## 2019-06-25 DIAGNOSIS — M9902 Segmental and somatic dysfunction of thoracic region: Secondary | ICD-10-CM | POA: Diagnosis not present

## 2019-06-25 DIAGNOSIS — M797 Fibromyalgia: Secondary | ICD-10-CM | POA: Diagnosis not present

## 2019-07-08 DIAGNOSIS — Z20822 Contact with and (suspected) exposure to covid-19: Secondary | ICD-10-CM | POA: Diagnosis not present

## 2019-07-08 DIAGNOSIS — Z03818 Encounter for observation for suspected exposure to other biological agents ruled out: Secondary | ICD-10-CM | POA: Diagnosis not present

## 2019-07-08 DIAGNOSIS — R509 Fever, unspecified: Secondary | ICD-10-CM | POA: Diagnosis not present

## 2019-07-08 DIAGNOSIS — Z20828 Contact with and (suspected) exposure to other viral communicable diseases: Secondary | ICD-10-CM | POA: Diagnosis not present

## 2019-08-07 DIAGNOSIS — M9903 Segmental and somatic dysfunction of lumbar region: Secondary | ICD-10-CM | POA: Diagnosis not present

## 2019-08-07 DIAGNOSIS — M797 Fibromyalgia: Secondary | ICD-10-CM | POA: Diagnosis not present

## 2019-08-07 DIAGNOSIS — M9902 Segmental and somatic dysfunction of thoracic region: Secondary | ICD-10-CM | POA: Diagnosis not present

## 2019-08-07 DIAGNOSIS — M9905 Segmental and somatic dysfunction of pelvic region: Secondary | ICD-10-CM | POA: Diagnosis not present

## 2019-08-14 DIAGNOSIS — M9902 Segmental and somatic dysfunction of thoracic region: Secondary | ICD-10-CM | POA: Diagnosis not present

## 2019-08-14 DIAGNOSIS — M797 Fibromyalgia: Secondary | ICD-10-CM | POA: Diagnosis not present

## 2019-08-14 DIAGNOSIS — M9905 Segmental and somatic dysfunction of pelvic region: Secondary | ICD-10-CM | POA: Diagnosis not present

## 2019-08-14 DIAGNOSIS — M9903 Segmental and somatic dysfunction of lumbar region: Secondary | ICD-10-CM | POA: Diagnosis not present

## 2019-08-22 DIAGNOSIS — M797 Fibromyalgia: Secondary | ICD-10-CM | POA: Diagnosis not present

## 2019-08-22 DIAGNOSIS — M9903 Segmental and somatic dysfunction of lumbar region: Secondary | ICD-10-CM | POA: Diagnosis not present

## 2019-08-22 DIAGNOSIS — M9905 Segmental and somatic dysfunction of pelvic region: Secondary | ICD-10-CM | POA: Diagnosis not present

## 2019-08-22 DIAGNOSIS — M9902 Segmental and somatic dysfunction of thoracic region: Secondary | ICD-10-CM | POA: Diagnosis not present

## 2019-08-28 DIAGNOSIS — M9903 Segmental and somatic dysfunction of lumbar region: Secondary | ICD-10-CM | POA: Diagnosis not present

## 2019-08-28 DIAGNOSIS — M9902 Segmental and somatic dysfunction of thoracic region: Secondary | ICD-10-CM | POA: Diagnosis not present

## 2019-08-28 DIAGNOSIS — M797 Fibromyalgia: Secondary | ICD-10-CM | POA: Diagnosis not present

## 2019-08-28 DIAGNOSIS — M9905 Segmental and somatic dysfunction of pelvic region: Secondary | ICD-10-CM | POA: Diagnosis not present

## 2019-09-11 DIAGNOSIS — M9905 Segmental and somatic dysfunction of pelvic region: Secondary | ICD-10-CM | POA: Diagnosis not present

## 2019-09-11 DIAGNOSIS — M9903 Segmental and somatic dysfunction of lumbar region: Secondary | ICD-10-CM | POA: Diagnosis not present

## 2019-09-11 DIAGNOSIS — M9902 Segmental and somatic dysfunction of thoracic region: Secondary | ICD-10-CM | POA: Diagnosis not present

## 2019-09-11 DIAGNOSIS — M797 Fibromyalgia: Secondary | ICD-10-CM | POA: Diagnosis not present

## 2019-09-25 DIAGNOSIS — M9902 Segmental and somatic dysfunction of thoracic region: Secondary | ICD-10-CM | POA: Diagnosis not present

## 2019-09-25 DIAGNOSIS — M9905 Segmental and somatic dysfunction of pelvic region: Secondary | ICD-10-CM | POA: Diagnosis not present

## 2019-09-25 DIAGNOSIS — M797 Fibromyalgia: Secondary | ICD-10-CM | POA: Diagnosis not present

## 2019-09-25 DIAGNOSIS — M9903 Segmental and somatic dysfunction of lumbar region: Secondary | ICD-10-CM | POA: Diagnosis not present

## 2019-09-26 DIAGNOSIS — G4709 Other insomnia: Secondary | ICD-10-CM | POA: Diagnosis not present

## 2019-09-26 DIAGNOSIS — F900 Attention-deficit hyperactivity disorder, predominantly inattentive type: Secondary | ICD-10-CM | POA: Diagnosis not present

## 2019-10-08 DIAGNOSIS — M9905 Segmental and somatic dysfunction of pelvic region: Secondary | ICD-10-CM | POA: Diagnosis not present

## 2019-10-08 DIAGNOSIS — M9903 Segmental and somatic dysfunction of lumbar region: Secondary | ICD-10-CM | POA: Diagnosis not present

## 2019-10-08 DIAGNOSIS — M9902 Segmental and somatic dysfunction of thoracic region: Secondary | ICD-10-CM | POA: Diagnosis not present

## 2019-10-08 DIAGNOSIS — M797 Fibromyalgia: Secondary | ICD-10-CM | POA: Diagnosis not present

## 2019-10-20 DIAGNOSIS — M9903 Segmental and somatic dysfunction of lumbar region: Secondary | ICD-10-CM | POA: Diagnosis not present

## 2019-10-20 DIAGNOSIS — M797 Fibromyalgia: Secondary | ICD-10-CM | POA: Diagnosis not present

## 2019-10-20 DIAGNOSIS — M9905 Segmental and somatic dysfunction of pelvic region: Secondary | ICD-10-CM | POA: Diagnosis not present

## 2019-10-20 DIAGNOSIS — M9902 Segmental and somatic dysfunction of thoracic region: Secondary | ICD-10-CM | POA: Diagnosis not present
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
## 2019-11-03 ENCOUNTER — Ambulatory Visit (INDEPENDENT_AMBULATORY_CARE_PROVIDER_SITE_OTHER): Payer: BC Managed Care – PPO | Admitting: Obstetrics & Gynecology

## 2019-11-03 ENCOUNTER — Other Ambulatory Visit (HOSPITAL_COMMUNITY)
Admission: RE | Admit: 2019-11-03 | Discharge: 2019-11-03 | Disposition: A | Discharge status: Home / Self Care | Payer: BC Managed Care – PPO | Source: Ambulatory Visit | Attending: Obstetrics & Gynecology | Admitting: Obstetrics & Gynecology

## 2019-11-03 ENCOUNTER — Other Ambulatory Visit: Payer: Self-pay

## 2019-11-03 ENCOUNTER — Encounter: Payer: Self-pay | Admitting: Obstetrics & Gynecology

## 2019-11-03 VITALS — BP 120/74 | HR 70 | Resp 16 | Ht 67.25 in | Wt 123.0 lb

## 2019-11-03 DIAGNOSIS — Z124 Encounter for screening for malignant neoplasm of cervix: Secondary | ICD-10-CM

## 2019-11-03 DIAGNOSIS — M85851 Other specified disorders of bone density and structure, right thigh: Secondary | ICD-10-CM | POA: Diagnosis not present

## 2019-11-03 DIAGNOSIS — Z0000 Encounter for general adult medical examination without abnormal findings: Secondary | ICD-10-CM

## 2019-11-03 DIAGNOSIS — R6882 Decreased libido: Secondary | ICD-10-CM

## 2019-11-03 DIAGNOSIS — M85852 Other specified disorders of bone density and structure, left thigh: Secondary | ICD-10-CM

## 2019-11-03 DIAGNOSIS — Z01419 Encounter for gynecological examination (general) (routine) without abnormal findings: Secondary | ICD-10-CM

## 2019-11-03 MED ORDER — NONFORMULARY OR COMPOUNDED ITEM: 1 refills | Status: DC

## 2019-11-04 LAB — CYTOLOGY - PAP
Comment: NEGATIVE
Diagnosis: NEGATIVE
High risk HPV: NEGATIVE

## 2019-11-06 ENCOUNTER — Other Ambulatory Visit: Payer: Self-pay

## 2019-11-06 ENCOUNTER — Other Ambulatory Visit (INDEPENDENT_AMBULATORY_CARE_PROVIDER_SITE_OTHER): Payer: BC Managed Care – PPO

## 2019-11-06 DIAGNOSIS — Z0000 Encounter for general adult medical examination without abnormal findings: Secondary | ICD-10-CM | POA: Diagnosis not present

## 2019-11-06 DIAGNOSIS — R6882 Decreased libido: Secondary | ICD-10-CM | POA: Diagnosis not present

## 2019-11-08 LAB — LIPID PANEL
Chol/HDL Ratio: 2.8 ratio (ref 0.0–4.4)
Cholesterol, Total: 252 mg/dL — ABNORMAL HIGH (ref 100–199)
HDL: 89 mg/dL (ref >39)
LDL Chol Calc (NIH): 154 mg/dL — ABNORMAL HIGH (ref 0–99)
Triglycerides: 58 mg/dL (ref 0–149)
VLDL Cholesterol Cal: 9 mg/dL (ref 5–40)

## 2019-11-08 LAB — BASIC METABOLIC PANEL
BUN/Creatinine Ratio: 18 (ref 9–23)
BUN: 16 mg/dL (ref 6–24)
CO2: 26 mmol/L (ref 20–29)
Calcium: 9.8 mg/dL (ref 8.7–10.2)
Chloride: 101 mmol/L (ref 96–106)
Creatinine, Ser: 0.88 mg/dL (ref 0.57–1.00)
GFR calc Af Amer: 84 mL/min/{1.73_m2} (ref >59)
GFR calc non Af Amer: 73 mL/min/{1.73_m2} (ref >59)
Glucose: 83 mg/dL (ref 65–99)
Potassium: 5.2 mmol/L (ref 3.5–5.2)
Sodium: 141 mmol/L (ref 134–144)

## 2019-11-08 LAB — TESTOSTERONE, TOTAL, LC/MS/MS: Testosterone, total: 30.8 ng/dL

## 2019-11-10 DIAGNOSIS — M797 Fibromyalgia: Secondary | ICD-10-CM | POA: Diagnosis not present

## 2019-11-10 DIAGNOSIS — M9903 Segmental and somatic dysfunction of lumbar region: Secondary | ICD-10-CM | POA: Diagnosis not present

## 2019-11-10 DIAGNOSIS — M9905 Segmental and somatic dysfunction of pelvic region: Secondary | ICD-10-CM | POA: Diagnosis not present

## 2019-11-10 DIAGNOSIS — M9902 Segmental and somatic dysfunction of thoracic region: Secondary | ICD-10-CM | POA: Diagnosis not present

## 2019-11-21 ENCOUNTER — Other Ambulatory Visit: Payer: Self-pay | Admitting: Obstetrics & Gynecology

## 2019-11-21 DIAGNOSIS — Z1231 Encounter for screening mammogram for malignant neoplasm of breast: Secondary | ICD-10-CM

## 2019-11-21 DIAGNOSIS — M85852 Other specified disorders of bone density and structure, left thigh: Secondary | ICD-10-CM

## 2019-11-21 DIAGNOSIS — M85851 Other specified disorders of bone density and structure, right thigh: Secondary | ICD-10-CM

## 2019-11-26 DIAGNOSIS — M9902 Segmental and somatic dysfunction of thoracic region: Secondary | ICD-10-CM | POA: Diagnosis not present

## 2019-11-26 DIAGNOSIS — M9905 Segmental and somatic dysfunction of pelvic region: Secondary | ICD-10-CM | POA: Diagnosis not present

## 2019-11-26 DIAGNOSIS — M797 Fibromyalgia: Secondary | ICD-10-CM | POA: Diagnosis not present

## 2019-11-26 DIAGNOSIS — M9903 Segmental and somatic dysfunction of lumbar region: Secondary | ICD-10-CM | POA: Diagnosis not present

## 2019-12-01 DIAGNOSIS — M9905 Segmental and somatic dysfunction of pelvic region: Secondary | ICD-10-CM | POA: Diagnosis not present

## 2019-12-01 DIAGNOSIS — M797 Fibromyalgia: Secondary | ICD-10-CM | POA: Diagnosis not present

## 2019-12-01 DIAGNOSIS — M9902 Segmental and somatic dysfunction of thoracic region: Secondary | ICD-10-CM | POA: Diagnosis not present

## 2019-12-01 DIAGNOSIS — M9903 Segmental and somatic dysfunction of lumbar region: Secondary | ICD-10-CM | POA: Diagnosis not present

## 2019-12-11 DIAGNOSIS — M797 Fibromyalgia: Secondary | ICD-10-CM | POA: Diagnosis not present

## 2019-12-11 DIAGNOSIS — M9903 Segmental and somatic dysfunction of lumbar region: Secondary | ICD-10-CM | POA: Diagnosis not present

## 2019-12-11 DIAGNOSIS — M9902 Segmental and somatic dysfunction of thoracic region: Secondary | ICD-10-CM | POA: Diagnosis not present

## 2019-12-11 DIAGNOSIS — M9905 Segmental and somatic dysfunction of pelvic region: Secondary | ICD-10-CM | POA: Diagnosis not present

## 2019-12-18 DIAGNOSIS — M9902 Segmental and somatic dysfunction of thoracic region: Secondary | ICD-10-CM | POA: Diagnosis not present

## 2019-12-18 DIAGNOSIS — M9905 Segmental and somatic dysfunction of pelvic region: Secondary | ICD-10-CM | POA: Diagnosis not present

## 2019-12-18 DIAGNOSIS — M797 Fibromyalgia: Secondary | ICD-10-CM | POA: Diagnosis not present

## 2019-12-18 DIAGNOSIS — M9903 Segmental and somatic dysfunction of lumbar region: Secondary | ICD-10-CM | POA: Diagnosis not present

## 2019-12-25 DIAGNOSIS — Z20822 Contact with and (suspected) exposure to covid-19: Secondary | ICD-10-CM | POA: Diagnosis not present

## 2019-12-31 DIAGNOSIS — U071 COVID-19: Secondary | ICD-10-CM | POA: Diagnosis not present

## 2019-12-31 DIAGNOSIS — R05 Cough: Secondary | ICD-10-CM | POA: Diagnosis not present

## 2020-01-03 DIAGNOSIS — R110 Nausea: Secondary | ICD-10-CM | POA: Diagnosis not present

## 2020-01-03 DIAGNOSIS — R05 Cough: Secondary | ICD-10-CM | POA: Diagnosis not present

## 2020-01-03 DIAGNOSIS — U071 COVID-19: Secondary | ICD-10-CM | POA: Diagnosis not present

## 2020-01-03 DIAGNOSIS — R197 Diarrhea, unspecified: Secondary | ICD-10-CM | POA: Diagnosis not present

## 2020-01-09 DIAGNOSIS — U071 COVID-19: Secondary | ICD-10-CM | POA: Diagnosis not present

## 2020-01-29 ENCOUNTER — Other Ambulatory Visit: Payer: Self-pay

## 2020-01-29 ENCOUNTER — Ambulatory Visit
Admission: RE | Admit: 2020-01-29 | Discharge: 2020-01-29 | Disposition: A | Discharge status: Home / Self Care | Payer: BC Managed Care – PPO | Source: Ambulatory Visit | Attending: Obstetrics & Gynecology | Admitting: Obstetrics & Gynecology

## 2020-01-29 DIAGNOSIS — M85851 Other specified disorders of bone density and structure, right thigh: Secondary | ICD-10-CM

## 2020-01-29 DIAGNOSIS — Z1231 Encounter for screening mammogram for malignant neoplasm of breast: Secondary | ICD-10-CM

## 2020-01-29 DIAGNOSIS — M85852 Other specified disorders of bone density and structure, left thigh: Secondary | ICD-10-CM

## 2020-01-29 DIAGNOSIS — Z780 Asymptomatic menopausal state: Secondary | ICD-10-CM | POA: Diagnosis not present

## 2020-03-08 DIAGNOSIS — M797 Fibromyalgia: Secondary | ICD-10-CM | POA: Diagnosis not present

## 2020-03-08 DIAGNOSIS — M9903 Segmental and somatic dysfunction of lumbar region: Secondary | ICD-10-CM | POA: Diagnosis not present

## 2020-03-08 DIAGNOSIS — M9902 Segmental and somatic dysfunction of thoracic region: Secondary | ICD-10-CM | POA: Diagnosis not present

## 2020-03-08 DIAGNOSIS — M9905 Segmental and somatic dysfunction of pelvic region: Secondary | ICD-10-CM | POA: Diagnosis not present

## 2020-06-09 ENCOUNTER — Ambulatory Visit: Payer: BC Managed Care – PPO

## 2020-06-14 ENCOUNTER — Ambulatory Visit: Payer: 59 | Attending: Internal Medicine

## 2020-06-14 DIAGNOSIS — Z23 Encounter for immunization: Secondary | ICD-10-CM

## 2020-06-14 NOTE — Progress Notes (Signed)
   Covid-19 Vaccination Clinic  Name:  [name]    MRN: [id number] DOB: [birth year]  06/14/2020  [name] was observed post Covid-19 immunization for 15 minutes without incident. She was provided with Vaccine Information Sheet and instruction to access the V-Safe system.   [name] was instructed to call 911 with any severe reactions post vaccine: Marland Kitchen Difficulty breathing  . Swelling of face and throat  . A fast heartbeat  . A bad rash all over body  . Dizziness and weakness   Immunizations Administered    Name Date Dose VIS Date Route   JANSSEN COVID-19 VACCINE 06/14/2020  1:33 PM 0.5 mL 02/11/2020 Intramuscular   Manufacturer: [employer]   Lot: [id number]   [place]: [phone]

## 2020-07-08 ENCOUNTER — Telehealth (HOSPITAL_BASED_OUTPATIENT_CLINIC_OR_DEPARTMENT_OTHER): Payer: Self-pay | Admitting: Obstetrics & Gynecology

## 2020-07-08 NOTE — Telephone Encounter (Signed)
Patient called in for a refill request be sent to her pharmacy for her testosterone cream.

## 2020-07-14 MED ORDER — NONFORMULARY OR COMPOUNDED ITEM: 1 refills | Status: DC

## 2020-07-14 NOTE — Addendum Note (Signed)
Addended by: Alen Blew on: 07/14/2020 04:01 PM   Modules accepted: Orders

## 2020-12-14 ENCOUNTER — Ambulatory Visit (HOSPITAL_BASED_OUTPATIENT_CLINIC_OR_DEPARTMENT_OTHER): Payer: 59 | Admitting: Obstetrics & Gynecology

## 2020-12-17 ENCOUNTER — Ambulatory Visit: Payer: BC Managed Care – PPO

## 2020-12-23 ENCOUNTER — Other Ambulatory Visit: Payer: Self-pay | Admitting: Gastroenterology

## 2020-12-23 ENCOUNTER — Ambulatory Visit
Admission: RE | Admit: 2020-12-23 | Discharge: 2020-12-23 | Disposition: A | Discharge status: Home / Self Care | Payer: 59 | Source: Ambulatory Visit | Attending: Gastroenterology | Admitting: Gastroenterology

## 2020-12-23 ENCOUNTER — Other Ambulatory Visit: Payer: Self-pay

## 2020-12-23 DIAGNOSIS — R935 Abnormal findings on diagnostic imaging of other abdominal regions, including retroperitoneum: Secondary | ICD-10-CM

## 2020-12-23 DIAGNOSIS — R109 Unspecified abdominal pain: Secondary | ICD-10-CM

## 2020-12-23 MED ORDER — IOPAMIDOL (ISOVUE-300) INJECTION 61%
100.0000 mL | Freq: Once | INTRAVENOUS | Status: AC | PRN
  Administered 2020-12-23: 100 mL via INTRAVENOUS

## 2021-02-02 ENCOUNTER — Ambulatory Visit (INDEPENDENT_AMBULATORY_CARE_PROVIDER_SITE_OTHER): Payer: 59 | Admitting: Obstetrics & Gynecology

## 2021-02-02 ENCOUNTER — Other Ambulatory Visit: Payer: Self-pay

## 2021-02-02 ENCOUNTER — Encounter (HOSPITAL_BASED_OUTPATIENT_CLINIC_OR_DEPARTMENT_OTHER): Payer: Self-pay | Admitting: Obstetrics & Gynecology

## 2021-02-02 VITALS — BP 151/85 | HR 53 | Ht 67.5 in | Wt 126.2 lb

## 2021-02-02 DIAGNOSIS — Z79899 Other long term (current) drug therapy: Secondary | ICD-10-CM

## 2021-02-02 DIAGNOSIS — E785 Hyperlipidemia, unspecified: Secondary | ICD-10-CM

## 2021-02-02 DIAGNOSIS — Z0000 Encounter for general adult medical examination without abnormal findings: Secondary | ICD-10-CM

## 2021-02-02 DIAGNOSIS — Z853 Personal history of malignant neoplasm of breast: Secondary | ICD-10-CM

## 2021-02-02 DIAGNOSIS — Z01419 Encounter for gynecological examination (general) (routine) without abnormal findings: Secondary | ICD-10-CM | POA: Diagnosis not present

## 2021-02-02 DIAGNOSIS — Z90722 Acquired absence of ovaries, bilateral: Secondary | ICD-10-CM

## 2021-02-02 DIAGNOSIS — Z780 Asymptomatic menopausal state: Secondary | ICD-10-CM

## 2021-02-02 DIAGNOSIS — G43109 Migraine with aura, not intractable, without status migrainosus: Secondary | ICD-10-CM

## 2021-02-02 DIAGNOSIS — D0511 Intraductal carcinoma in situ of right breast: Secondary | ICD-10-CM

## 2021-02-02 MED ORDER — NONFORMULARY OR COMPOUNDED ITEM: 1 refills | Status: DC
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
## 2021-02-04 LAB — LIPID PANEL
Chol/HDL Ratio: 2.6 ratio (ref 0.0–4.4)
Cholesterol, Total: 251 mg/dL — ABNORMAL HIGH (ref 100–199)
HDL: 98 mg/dL (ref >39)
LDL Chol Calc (NIH): 142 mg/dL — ABNORMAL HIGH (ref 0–99)
Triglycerides: 66 mg/dL (ref 0–149)
VLDL Cholesterol Cal: 11 mg/dL (ref 5–40)

## 2021-02-04 LAB — TSH: TSH: 2.71 u[IU]/mL (ref 0.450–4.500)

## 2021-02-04 LAB — TESTOSTERONE, TOTAL, LC/MS/MS: Testosterone, total: 16.6 ng/dL (ref 7.0–40.0)

## 2021-02-09 IMAGING — MG DIGITAL SCREENING BILAT W/ CAD
4 series · 4 of 4 positions shown · non-contrast
Comparison: Previous exam(s).

CLINICAL DATA: Screening.

EXAM:
DIGITAL SCREENING BILATERAL MAMMOGRAM WITH CAD

[L CC]
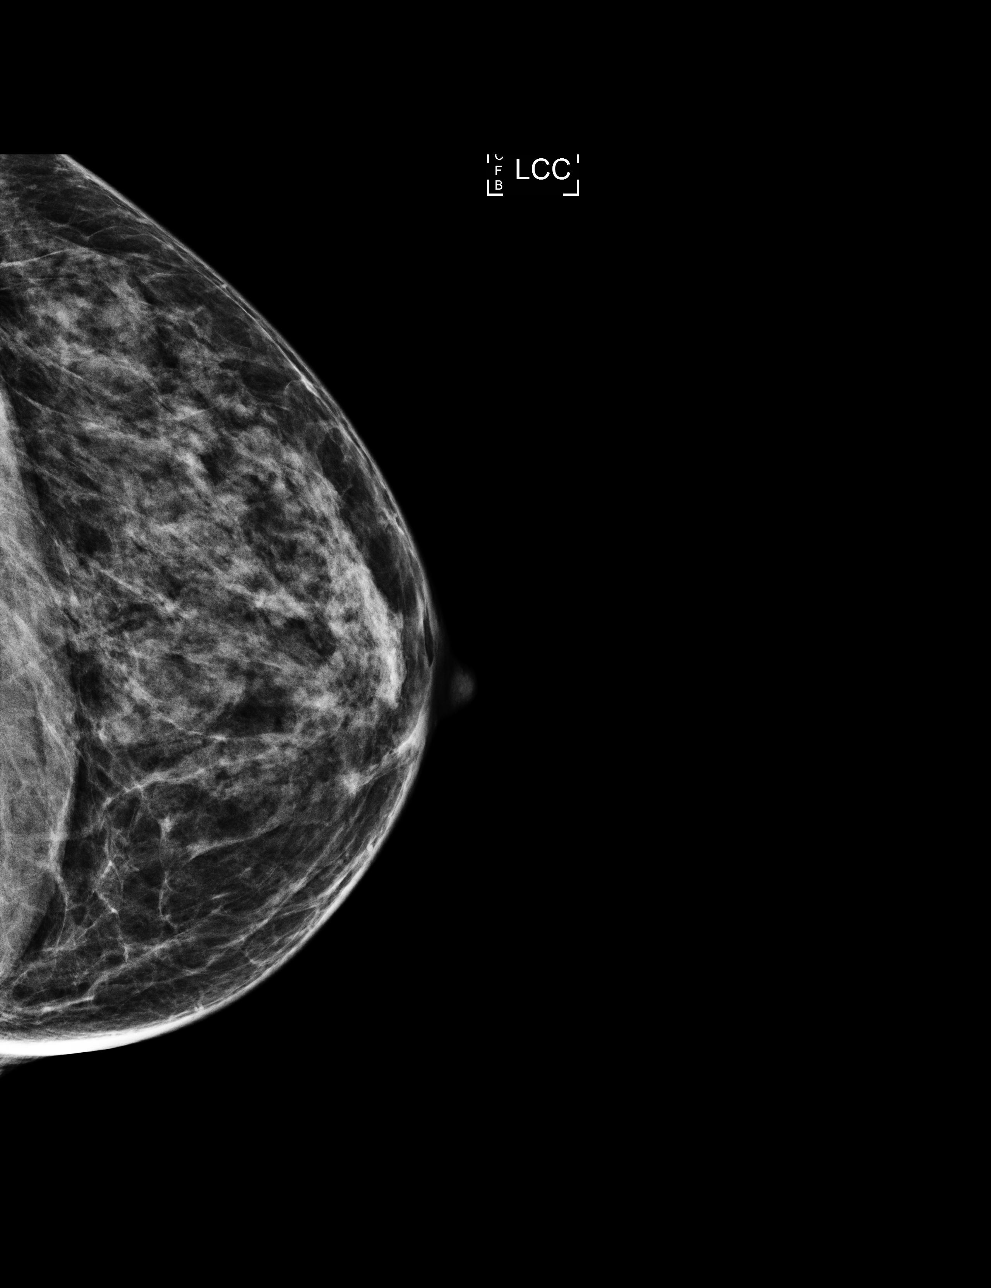

[R MLO]
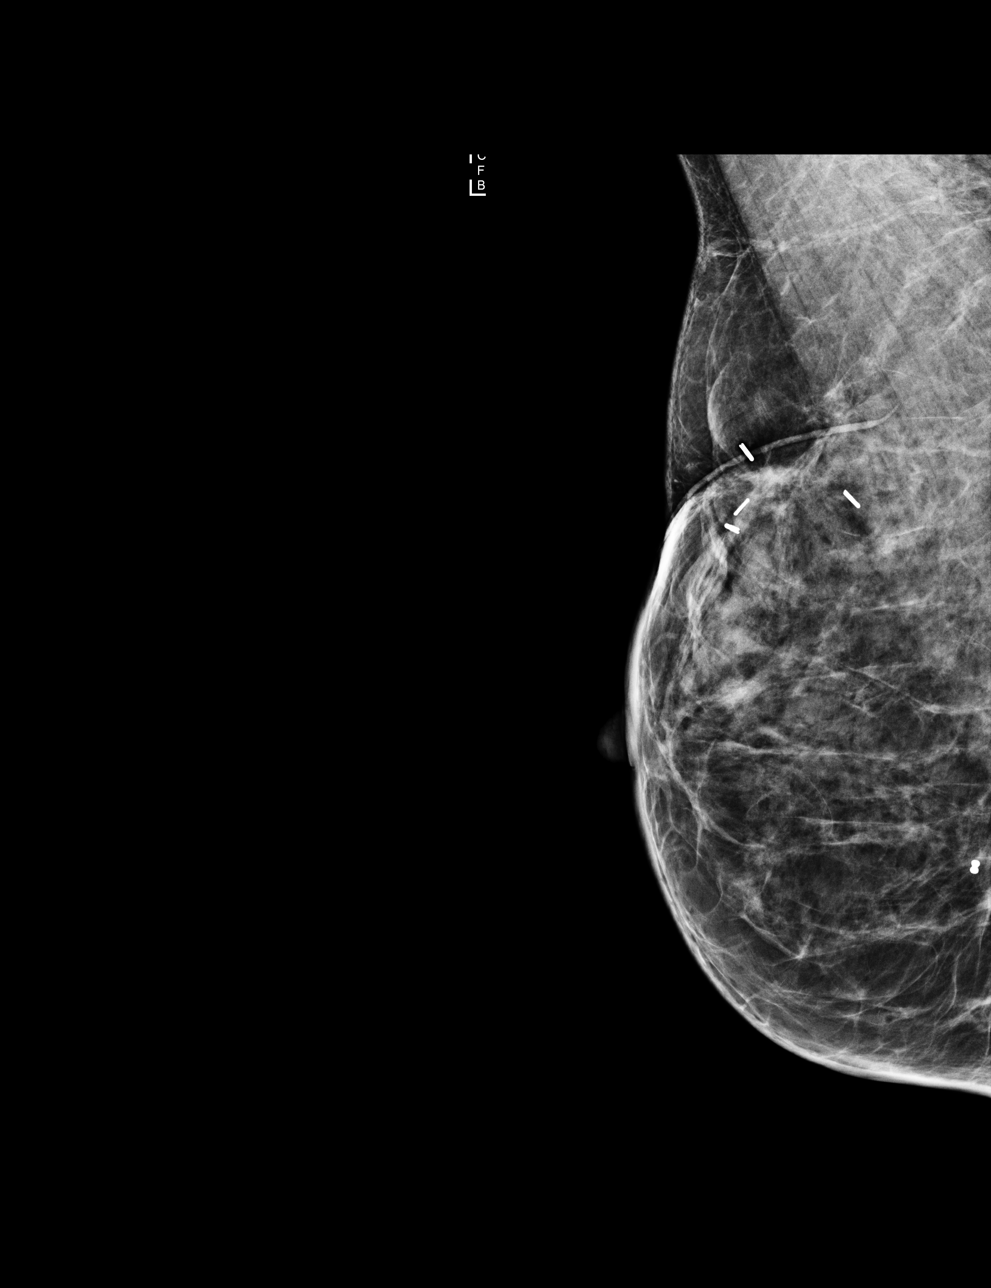

[R CC]
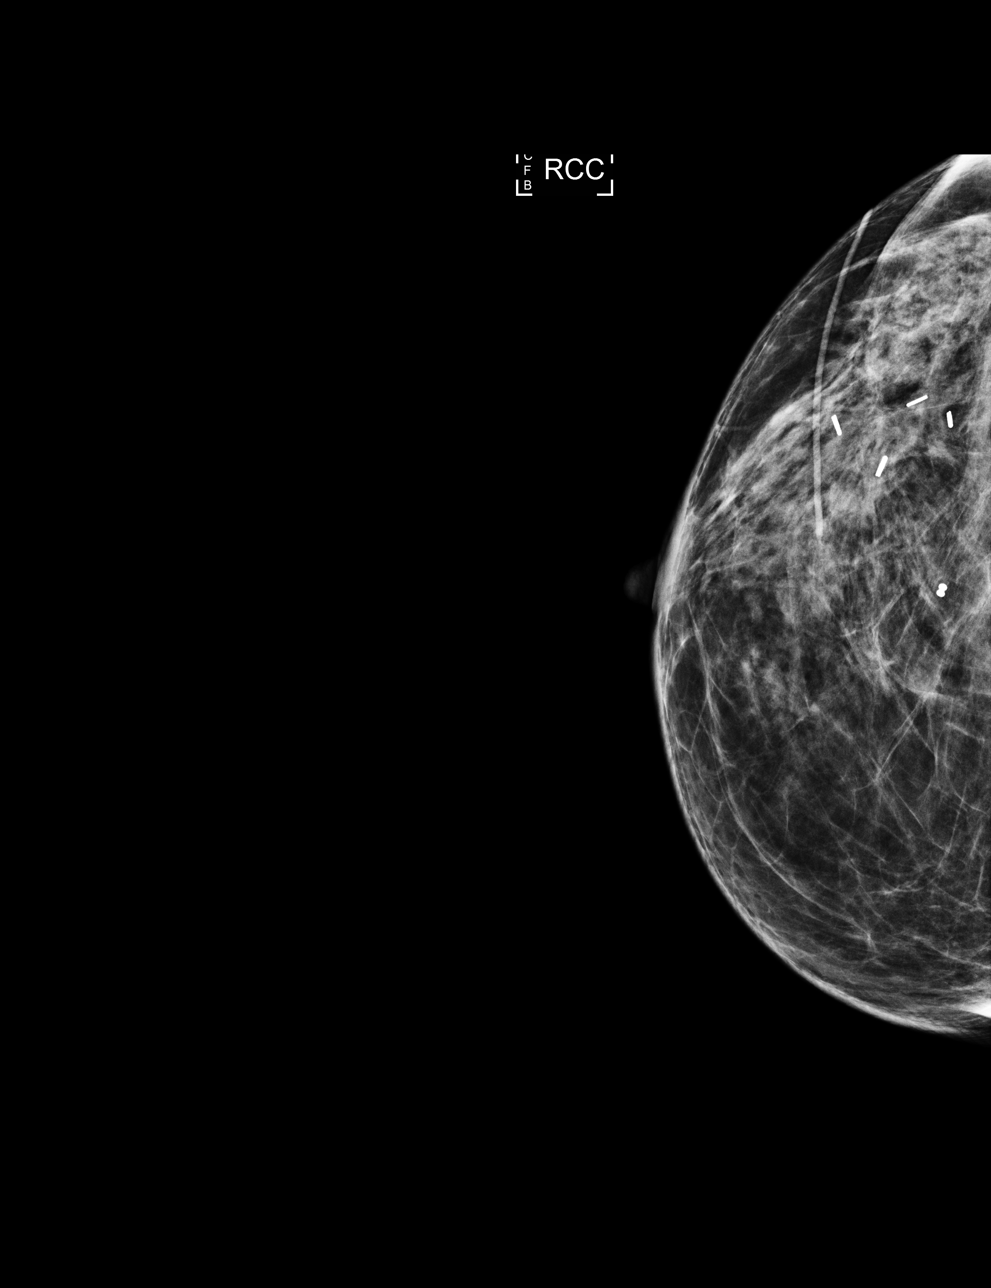

[L MLO]
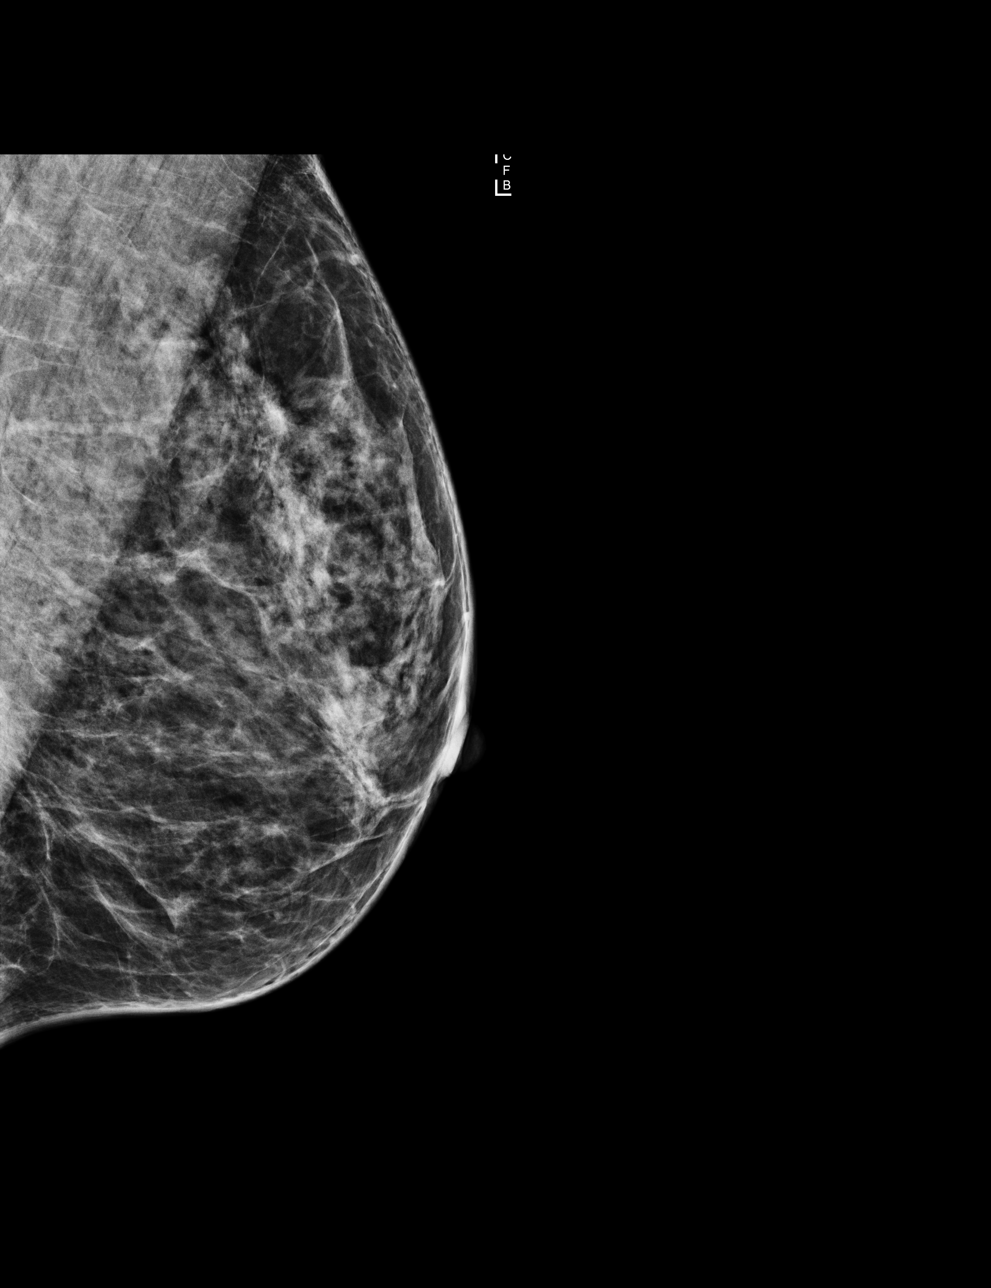

[4 of 4 positions shown; findings below may reference images not displayed]

ACR Breast Density Category c: The breast tissue is heterogeneously
dense, which may obscure small masses.
FINDINGS: There are no findings suspicious for malignancy. Images were
processed with CAD.
IMPRESSION: No mammographic evidence of malignancy. A result letter of this
screening mammogram will be mailed directly to the patient.

RECOMMENDATION:
Screening mammogram in one year. (Code:YJ-[id number])

BI-RADS CATEGORY  1: Negative.

## 2021-02-23 ENCOUNTER — Other Ambulatory Visit: Payer: Self-pay | Admitting: Obstetrics & Gynecology

## 2021-02-23 DIAGNOSIS — Z1231 Encounter for screening mammogram for malignant neoplasm of breast: Secondary | ICD-10-CM

## 2021-02-28 ENCOUNTER — Ambulatory Visit
Admission: RE | Admit: 2021-02-28 | Discharge: 2021-02-28 | Disposition: A | Discharge status: Home / Self Care | Payer: 59 | Source: Ambulatory Visit | Attending: Obstetrics & Gynecology | Admitting: Obstetrics & Gynecology

## 2021-02-28 ENCOUNTER — Other Ambulatory Visit: Payer: Self-pay

## 2021-02-28 DIAGNOSIS — Z1231 Encounter for screening mammogram for malignant neoplasm of breast: Secondary | ICD-10-CM

## 2021-06-09 ENCOUNTER — Other Ambulatory Visit: Payer: Self-pay

## 2021-06-09 ENCOUNTER — Ambulatory Visit (INDEPENDENT_AMBULATORY_CARE_PROVIDER_SITE_OTHER): Payer: 59 | Admitting: Orthopedic Surgery

## 2021-06-09 ENCOUNTER — Ambulatory Visit: Payer: Self-pay

## 2021-06-09 DIAGNOSIS — M25511 Pain in right shoulder: Secondary | ICD-10-CM | POA: Diagnosis not present

## 2021-06-09 DIAGNOSIS — M7541 Impingement syndrome of right shoulder: Secondary | ICD-10-CM

## 2021-06-09 DIAGNOSIS — G8929 Other chronic pain: Secondary | ICD-10-CM | POA: Diagnosis not present

## 2021-06-12 ENCOUNTER — Encounter: Payer: Self-pay | Admitting: Orthopedic Surgery

## 2021-06-12 DIAGNOSIS — M25511 Pain in right shoulder: Secondary | ICD-10-CM

## 2021-06-12 DIAGNOSIS — G8929 Other chronic pain: Secondary | ICD-10-CM | POA: Diagnosis not present

## 2021-06-12 DIAGNOSIS — M7541 Impingement syndrome of right shoulder: Secondary | ICD-10-CM | POA: Diagnosis not present

## 2021-06-12 MED ORDER — LIDOCAINE HCL 1 % IJ SOLN
5.0000 mL | INTRAMUSCULAR | Status: AC | PRN
  Administered 2021-06-12: 5 mL

## 2021-06-12 MED ORDER — METHYLPREDNISOLONE ACETATE 40 MG/ML IJ SUSP
40.0000 mg | INTRAMUSCULAR | Status: AC | PRN
  Administered 2021-06-12: 40 mg via INTRA_ARTICULAR
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
## 2021-09-09 ENCOUNTER — Other Ambulatory Visit (HOSPITAL_BASED_OUTPATIENT_CLINIC_OR_DEPARTMENT_OTHER): Payer: Self-pay | Admitting: Obstetrics & Gynecology

## 2021-09-09 DIAGNOSIS — Z780 Asymptomatic menopausal state: Secondary | ICD-10-CM

## 2021-09-09 MED ORDER — NONFORMULARY OR COMPOUNDED ITEM: 1 refills | Status: DC

## 2022-02-14 ENCOUNTER — Ambulatory Visit (INDEPENDENT_AMBULATORY_CARE_PROVIDER_SITE_OTHER): Payer: 59 | Admitting: Obstetrics & Gynecology

## 2022-02-14 ENCOUNTER — Encounter (HOSPITAL_BASED_OUTPATIENT_CLINIC_OR_DEPARTMENT_OTHER): Payer: Self-pay | Admitting: Obstetrics & Gynecology

## 2022-02-14 VITALS — BP 118/72 | HR 45 | Ht 67.5 in | Wt 126.0 lb

## 2022-02-14 DIAGNOSIS — G43109 Migraine with aura, not intractable, without status migrainosus: Secondary | ICD-10-CM

## 2022-02-14 DIAGNOSIS — Z780 Asymptomatic menopausal state: Secondary | ICD-10-CM | POA: Diagnosis not present

## 2022-02-14 DIAGNOSIS — Z800 Family history of malignant neoplasm of digestive organs: Secondary | ICD-10-CM | POA: Diagnosis not present

## 2022-02-14 DIAGNOSIS — Z0000 Encounter for general adult medical examination without abnormal findings: Secondary | ICD-10-CM

## 2022-02-14 DIAGNOSIS — Z01419 Encounter for gynecological examination (general) (routine) without abnormal findings: Secondary | ICD-10-CM | POA: Diagnosis not present

## 2022-02-14 DIAGNOSIS — Z90722 Acquired absence of ovaries, bilateral: Secondary | ICD-10-CM

## 2022-02-14 MED ORDER — NONFORMULARY OR COMPOUNDED ITEM: 1 refills | Status: DC
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
## 2022-02-14 NOTE — Addendum Note (Signed)
Addended by: Blenda Nicely on: 02/14/2022 10:15 AM   Modules accepted: Orders

## 2022-02-15 LAB — COMPREHENSIVE METABOLIC PANEL
ALT: 17 IU/L (ref 0–32)
AST: 26 IU/L (ref 0–40)
Albumin/Globulin Ratio: 2.1 (ref 1.2–2.2)
Albumin: 4.7 g/dL (ref 3.9–4.9)
Alkaline Phosphatase: 83 IU/L (ref 44–121)
BUN/Creatinine Ratio: 22 (ref 12–28)
BUN: 17 mg/dL (ref 8–27)
Bilirubin Total: 0.8 mg/dL (ref 0.0–1.2)
CO2: 24 mmol/L (ref 20–29)
Calcium: 9.6 mg/dL (ref 8.7–10.3)
Chloride: 100 mmol/L (ref 96–106)
Creatinine, Ser: 0.76 mg/dL (ref 0.57–1.00)
Globulin, Total: 2.2 g/dL (ref 1.5–4.5)
Glucose: 89 mg/dL (ref 70–99)
Potassium: 4.4 mmol/L (ref 3.5–5.2)
Sodium: 140 mmol/L (ref 134–144)
Total Protein: 6.9 g/dL (ref 6.0–8.5)
eGFR: 89 mL/min/{1.73_m2} (ref >59)

## 2022-02-15 LAB — CBC
Hematocrit: 41.4 % (ref 34.0–46.6)
Hemoglobin: 13.9 g/dL (ref 11.1–15.9)
MCH: 32.7 pg (ref 26.6–33.0)
MCHC: 33.6 g/dL (ref 31.5–35.7)
MCV: 97 fL (ref 79–97)
Platelets: 221 10*3/uL (ref 150–450)
RBC: 4.25 x10E6/uL (ref 3.77–5.28)
RDW: 11.7 % (ref 11.7–15.4)
WBC: 5.1 10*3/uL (ref 3.4–10.8)

## 2022-02-15 LAB — LIPID PANEL
Chol/HDL Ratio: 2.3 ratio (ref 0.0–4.4)
Cholesterol, Total: 222 mg/dL — ABNORMAL HIGH (ref 100–199)
HDL: 96 mg/dL (ref >39)
LDL Chol Calc (NIH): 114 mg/dL — ABNORMAL HIGH (ref 0–99)
Triglycerides: 70 mg/dL (ref 0–149)
VLDL Cholesterol Cal: 12 mg/dL (ref 5–40)

## 2022-02-15 LAB — TSH: TSH: 3.05 u[IU]/mL (ref 0.450–4.500)

## 2022-02-15 LAB — HEMOGLOBIN A1C
Est. average glucose Bld gHb Est-mCnc: 103 mg/dL
Hgb A1c MFr Bld: 5.2 % (ref 4.8–5.6)

## 2022-09-11 ENCOUNTER — Other Ambulatory Visit (HOSPITAL_BASED_OUTPATIENT_CLINIC_OR_DEPARTMENT_OTHER): Payer: Self-pay | Admitting: Obstetrics & Gynecology

## 2022-09-11 DIAGNOSIS — Z780 Asymptomatic menopausal state: Secondary | ICD-10-CM

## 2022-09-11 MED ORDER — NONFORMULARY OR COMPOUNDED ITEM: 1 refills | Status: DC

## 2022-09-12 ENCOUNTER — Other Ambulatory Visit (HOSPITAL_BASED_OUTPATIENT_CLINIC_OR_DEPARTMENT_OTHER): Payer: Self-pay | Admitting: *Deleted

## 2022-09-12 DIAGNOSIS — Z780 Asymptomatic menopausal state: Secondary | ICD-10-CM

## 2022-09-26 ENCOUNTER — Other Ambulatory Visit (HOSPITAL_BASED_OUTPATIENT_CLINIC_OR_DEPARTMENT_OTHER): Payer: Self-pay | Admitting: Physician Assistant

## 2022-09-26 DIAGNOSIS — E782 Mixed hyperlipidemia: Secondary | ICD-10-CM

## 2022-10-20 ENCOUNTER — Ambulatory Visit (HOSPITAL_BASED_OUTPATIENT_CLINIC_OR_DEPARTMENT_OTHER)
Admission: RE | Admit: 2022-10-20 | Discharge: 2022-10-20 | Disposition: A | Discharge status: Home / Self Care | Payer: 59 | Source: Ambulatory Visit | Attending: Physician Assistant | Admitting: Physician Assistant

## 2022-10-20 DIAGNOSIS — E782 Mixed hyperlipidemia: Secondary | ICD-10-CM | POA: Insufficient documentation

## 2023-01-29 ENCOUNTER — Other Ambulatory Visit: Payer: Self-pay | Admitting: Obstetrics & Gynecology

## 2023-01-29 DIAGNOSIS — Z0000 Encounter for general adult medical examination without abnormal findings: Secondary | ICD-10-CM

## 2023-01-30 ENCOUNTER — Inpatient Hospital Stay
Admission: RE | Admit: 2023-01-30 | Discharge: 2023-01-30 | Discharge status: Home / Self Care | Payer: 59 | Source: Ambulatory Visit | Attending: Obstetrics & Gynecology | Admitting: Obstetrics & Gynecology

## 2023-01-30 DIAGNOSIS — Z0000 Encounter for general adult medical examination without abnormal findings: Secondary | ICD-10-CM

## 2023-03-08 ENCOUNTER — Encounter (HOSPITAL_BASED_OUTPATIENT_CLINIC_OR_DEPARTMENT_OTHER): Payer: Self-pay | Admitting: Obstetrics & Gynecology

## 2023-03-08 ENCOUNTER — Ambulatory Visit (HOSPITAL_BASED_OUTPATIENT_CLINIC_OR_DEPARTMENT_OTHER): Payer: 59 | Admitting: Obstetrics & Gynecology

## 2023-03-08 ENCOUNTER — Other Ambulatory Visit (HOSPITAL_COMMUNITY)
Admission: RE | Admit: 2023-03-08 | Discharge: 2023-03-08 | Disposition: A | Discharge status: Home / Self Care | Payer: 59 | Source: Ambulatory Visit | Attending: Obstetrics & Gynecology | Admitting: Obstetrics & Gynecology

## 2023-03-08 VITALS — BP 160/80 | HR 50 | Ht 67.75 in | Wt 125.2 lb

## 2023-03-08 DIAGNOSIS — Z803 Family history of malignant neoplasm of breast: Secondary | ICD-10-CM

## 2023-03-08 DIAGNOSIS — Z124 Encounter for screening for malignant neoplasm of cervix: Secondary | ICD-10-CM

## 2023-03-08 DIAGNOSIS — Z01419 Encounter for gynecological examination (general) (routine) without abnormal findings: Secondary | ICD-10-CM | POA: Diagnosis not present

## 2023-03-08 DIAGNOSIS — M1612 Unilateral primary osteoarthritis, left hip: Secondary | ICD-10-CM | POA: Diagnosis not present

## 2023-03-08 DIAGNOSIS — M85851 Other specified disorders of bone density and structure, right thigh: Secondary | ICD-10-CM

## 2023-03-08 DIAGNOSIS — E7800 Pure hypercholesterolemia, unspecified: Secondary | ICD-10-CM

## 2023-03-08 DIAGNOSIS — R030 Elevated blood-pressure reading, without diagnosis of hypertension: Secondary | ICD-10-CM | POA: Diagnosis not present

## 2023-03-08 DIAGNOSIS — Z780 Asymptomatic menopausal state: Secondary | ICD-10-CM

## 2023-03-08 DIAGNOSIS — Z8639 Personal history of other endocrine, nutritional and metabolic disease: Secondary | ICD-10-CM

## 2023-03-08 MED ORDER — NONFORMULARY OR COMPOUNDED ITEM: 1 refills | Status: DC

## 2023-03-08 NOTE — Patient Instructions (Signed)
all [phone] to scheduled at the [facility], [address].  [place], [place] [address]

## 2023-03-08 NOTE — Addendum Note (Signed)
Addended by: Jerene Bears on: 03/08/2023 10:56 AM   Modules accepted: Orders
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
## 2023-03-09 LAB — LIPID PANEL
Chol/HDL Ratio: 2.9 ratio (ref 0.0–4.4)
Cholesterol, Total: 247 mg/dL — ABNORMAL HIGH (ref 100–199)
HDL: 85 mg/dL (ref >39)
LDL Chol Calc (NIH): 155 mg/dL — ABNORMAL HIGH (ref 0–99)
Triglycerides: 44 mg/dL (ref 0–149)
VLDL Cholesterol Cal: 7 mg/dL (ref 5–40)

## 2023-03-09 LAB — CYTOLOGY - PAP
Comment: NEGATIVE
Diagnosis: NEGATIVE
High risk HPV: NEGATIVE

## 2023-03-13 LAB — VITAMIN B12: Vitamin B-12: 1131 pg/mL (ref 232–1245)

## 2023-03-13 LAB — TESTOSTERONE, TOTAL, LC/MS/MS: Testosterone, total: 14.1 ng/dL (ref 7.0–40.0)

## 2023-07-23 ENCOUNTER — Ambulatory Visit (INDEPENDENT_AMBULATORY_CARE_PROVIDER_SITE_OTHER): Admitting: Orthopedic Surgery

## 2023-07-23 DIAGNOSIS — M7502 Adhesive capsulitis of left shoulder: Secondary | ICD-10-CM

## 2023-07-24 ENCOUNTER — Encounter: Payer: Self-pay | Admitting: Orthopedic Surgery

## 2023-07-24 DIAGNOSIS — M7502 Adhesive capsulitis of left shoulder: Secondary | ICD-10-CM

## 2023-07-24 MED ORDER — LIDOCAINE HCL 1 % IJ SOLN
5.0000 mL | INTRAMUSCULAR | Status: AC | PRN
  Administered 2023-07-24: 5 mL

## 2023-07-24 MED ORDER — METHYLPREDNISOLONE ACETATE 40 MG/ML IJ SUSP
40.0000 mg | INTRAMUSCULAR | Status: AC | PRN
  Administered 2023-07-24: 40 mg via INTRA_ARTICULAR
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
## 2023-09-03 ENCOUNTER — Other Ambulatory Visit (INDEPENDENT_AMBULATORY_CARE_PROVIDER_SITE_OTHER)

## 2023-09-03 ENCOUNTER — Ambulatory Visit (INDEPENDENT_AMBULATORY_CARE_PROVIDER_SITE_OTHER): Admitting: Orthopedic Surgery

## 2023-09-03 DIAGNOSIS — M25532 Pain in left wrist: Secondary | ICD-10-CM

## 2023-09-03 DIAGNOSIS — H9201 Otalgia, right ear: Secondary | ICD-10-CM

## 2023-09-04 ENCOUNTER — Encounter: Payer: Self-pay | Admitting: Orthopedic Surgery
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
## 2023-09-27 ENCOUNTER — Other Ambulatory Visit: Payer: Self-pay | Admitting: Family Medicine

## 2023-09-27 DIAGNOSIS — R911 Solitary pulmonary nodule: Secondary | ICD-10-CM

## 2023-09-28 ENCOUNTER — Encounter: Payer: Self-pay | Admitting: Family Medicine

## 2023-10-02 ENCOUNTER — Ambulatory Visit
Admission: RE | Admit: 2023-10-02 | Discharge: 2023-10-02 | Disposition: A | Discharge status: Home / Self Care | Source: Ambulatory Visit | Attending: Family Medicine

## 2023-10-02 DIAGNOSIS — R911 Solitary pulmonary nodule: Secondary | ICD-10-CM

## 2023-10-22 ENCOUNTER — Other Ambulatory Visit (HOSPITAL_BASED_OUTPATIENT_CLINIC_OR_DEPARTMENT_OTHER): Payer: Self-pay

## 2023-10-22 DIAGNOSIS — Z780 Asymptomatic menopausal state: Secondary | ICD-10-CM

## 2023-10-22 MED ORDER — NONFORMULARY OR COMPOUNDED ITEM: 1 refills | Status: DC

## 2023-10-31 ENCOUNTER — Other Ambulatory Visit: Payer: 59

## 2023-11-07 ENCOUNTER — Ambulatory Visit (HOSPITAL_BASED_OUTPATIENT_CLINIC_OR_DEPARTMENT_OTHER)
Admission: RE | Admit: 2023-11-07 | Discharge: 2023-11-07 | Disposition: A | Discharge status: Home / Self Care | Source: Ambulatory Visit | Attending: Obstetrics & Gynecology | Admitting: Obstetrics & Gynecology

## 2023-11-07 DIAGNOSIS — M85851 Other specified disorders of bone density and structure, right thigh: Secondary | ICD-10-CM | POA: Insufficient documentation

## 2023-11-30 ENCOUNTER — Ambulatory Visit (HOSPITAL_BASED_OUTPATIENT_CLINIC_OR_DEPARTMENT_OTHER): Payer: Self-pay | Admitting: Obstetrics & Gynecology

## 2024-01-30 ENCOUNTER — Ambulatory Visit (INDEPENDENT_AMBULATORY_CARE_PROVIDER_SITE_OTHER): Admitting: Orthopedic Surgery

## 2024-01-30 ENCOUNTER — Other Ambulatory Visit

## 2024-01-30 DIAGNOSIS — M79641 Pain in right hand: Secondary | ICD-10-CM

## 2024-01-30 DIAGNOSIS — M720 Palmar fascial fibromatosis [Dupuytren]: Secondary | ICD-10-CM
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
## 2024-02-25 ENCOUNTER — Encounter: Payer: Self-pay | Admitting: Radiology

## 2024-03-24 ENCOUNTER — Encounter (HOSPITAL_BASED_OUTPATIENT_CLINIC_OR_DEPARTMENT_OTHER): Payer: Self-pay | Admitting: Obstetrics & Gynecology

## 2024-03-24 ENCOUNTER — Ambulatory Visit (HOSPITAL_BASED_OUTPATIENT_CLINIC_OR_DEPARTMENT_OTHER): Payer: 59 | Admitting: Obstetrics & Gynecology

## 2024-03-24 VITALS — BP 124/82 | HR 59 | Ht 68.0 in | Wt 127.6 lb

## 2024-03-24 DIAGNOSIS — Z800 Family history of malignant neoplasm of digestive organs: Secondary | ICD-10-CM

## 2024-03-24 DIAGNOSIS — Z86000 Personal history of in-situ neoplasm of breast: Secondary | ICD-10-CM

## 2024-03-24 DIAGNOSIS — R9230 Dense breasts, unspecified: Secondary | ICD-10-CM

## 2024-03-24 DIAGNOSIS — Z01419 Encounter for gynecological examination (general) (routine) without abnormal findings: Secondary | ICD-10-CM | POA: Diagnosis not present

## 2024-03-24 DIAGNOSIS — Z1331 Encounter for screening for depression: Secondary | ICD-10-CM | POA: Diagnosis not present

## 2024-03-24 DIAGNOSIS — Z90722 Acquired absence of ovaries, bilateral: Secondary | ICD-10-CM | POA: Diagnosis not present

## 2024-03-24 DIAGNOSIS — Z9079 Acquired absence of other genital organ(s): Secondary | ICD-10-CM | POA: Diagnosis not present

## 2024-03-24 DIAGNOSIS — Z780 Asymptomatic menopausal state: Secondary | ICD-10-CM

## 2024-03-24 MED ORDER — NONFORMULARY OR COMPOUNDED ITEM: 1 refills | Status: AC

## 2024-03-24 NOTE — Progress Notes (Signed)
 ANNUAL EXAM Patient name: [name] MRN [id number]  Date of birth: 1961/04/23 Chief Complaint:   AEX  History of Present Illness:   [name] is a 63 y.o. [phone] Caucasian female being seen today for a routine annual exam.  Doing well.  Visited her daughter, son-in-law and grandchild.  She is expecting again.  Husband's bladder cancer has returned and is now during chemotherapy instillations.  Denies vaginal bleeding.  On topical testosterone .  H/o DCIS.    Had coronary calcium score done this year.  Had 4.77mm nodule so will repeat CT scan next year.  Followed by PCP.  Lab work done in June as well.    Patient's last menstrual period was 11/30/2010.   Last pap 03/08/2023. Results were: NILM w/ HRHPV negative. H/O abnormal pap: no Last mammogram: 01/30/2023. Results were: normal. Family h/o breast cancer: yes mother. Last colonoscopy: 09/08/2013. Results were: normal.Patient reports that she had one this year and everything was normal with Dr. Rosalie, will request records. Family h/o colorectal cancer: yes mother and brother. DEXA:  11/07/2023.  T score -2.2     03/24/2024    8:16 AM 03/08/2023    8:24 AM 02/14/2022    9:25 AM 02/02/2021    9:01 AM 05/21/2017   10:42 AM  Depression screen PHQ 2/9  Decreased Interest 0 0 0 0 0  Down, Depressed, Hopeless 0 0 0 0 0  PHQ - 2 Score 0 0 0 0 0    Review of Systems:   Pertinent items are noted in HPI Denies any urinary or bowel changes.  Denies pelvic pain. Pertinent History Reviewed:  Reviewed past medical,surgical, social and family history.  Reviewed problem list, medications and allergies. Physical Assessment:   Vitals:   03/24/24 0815  BP: 124/82  Pulse: (!) 59  SpO2: 100%  Weight: 127 lb 9.6 oz (57.9 kg)  Height: 5' 8 (1.727 m)  Body mass index is 19.4 kg/m.        Physical Examination:   General appearance - well appearing, and in no distress  Mental status - alert, oriented to person, place, and  time  Psych:  She has a normal mood and affect  Skin - warm and dry, normal color, no suspicious lesions noted  Chest - effort normal, all lung fields clear to auscultation bilaterally  Heart - normal rate and regular rhythm  Neck:  midline trachea, no thyromegaly or nodules  Breasts - breasts appear normal, no suspicious masses, no skin or nipple changes or  axillary nodes  Abdomen - soft, nontender, nondistended, no masses or organomegaly  Pelvic - VULVA: normal appearing vulva with no masses, tenderness or lesions   VAGINA: normal appearing vagina with normal color and discharge, no lesions   CERVIX: normal appearing cervix without discharge or lesions, no CMT  Thin prep pap is not done.    UTERUS: uterus is felt to be normal size, shape, consistency and nontender   ADNEXA: No adnexal masses or tenderness noted.  Rectal - normal rectal, good sphincter tone, no masses felt.   Extremities:  No swelling or varicosities noted  Chaperone present for exam  No results found for this or any previous visit (from the past 24 hours).  Assessment & Plan:  1. Well woman exam with routine gynecological exam (Primary) - Pap smear not indicated today. - Mammogram discussed today.  Planning contrast enhanced mammogram.   - Colonoscopy done 09/2023 with Dr. Rosalie - Bone mineral density 10/2022 -  lab work done with PCP at [place].  Reviewed. - vaccines reviewed/updated  2. Postmenopausal - testosterone  level normal last year.  Will repeat next year. - NONFORMULARY OR COMPOUNDED ITEM; Testosterone  propionate petroleum (jar) 2% ointment.  Use 1/4 tsp three times weekly.  Disp: 60gm  Dispense: 1 each; Refill: 1  3. History of ductal carcinoma in situ (DCIS) of breast - MM 2D DIAG BILAT WITH CONTRAST BCG ONLY; Future  4. Breast density  5. S/P BSO (bilateral salpingo-oophorectomy)  6. Family history of colon cancer    No orders of the defined types were placed in this encounter.   Meds: No  orders of the defined types were placed in this encounter.   Follow-up: No follow-ups on file.  Ronal GORMAN Pinal, MD 03/24/2024 8:39 AM
